# Patient Record
Sex: Female | Born: 1970 | Race: White | Hispanic: No | Marital: Married | Smoking: Never smoker
Health system: Southern US, Community
[De-identification: ages and names within clinical notes are randomized; demographics above are authoritative.]

## PROBLEM LIST (undated history)

## (undated) DIAGNOSIS — K589 Irritable bowel syndrome without diarrhea: Secondary | ICD-10-CM

## (undated) DIAGNOSIS — J45909 Unspecified asthma, uncomplicated: Secondary | ICD-10-CM

## (undated) DIAGNOSIS — T7840XA Allergy, unspecified, initial encounter: Secondary | ICD-10-CM

## (undated) DIAGNOSIS — D649 Anemia, unspecified: Secondary | ICD-10-CM

## (undated) HISTORY — PX: TONSILLECTOMY: SUR1361

## (undated) HISTORY — DX: Irritable bowel syndrome, unspecified: K58.9

## (undated) HISTORY — PX: COLONOSCOPY: SHX174

## (undated) HISTORY — DX: Allergy, unspecified, initial encounter: T78.40XA

## (undated) HISTORY — DX: Anemia, unspecified: D64.9

## (undated) HISTORY — PX: BREAST ENHANCEMENT SURGERY: SHX7

## (undated) HISTORY — PX: TYMPANOSTOMY TUBE PLACEMENT: SHX32

## (undated) HISTORY — PX: PARTIAL HYSTERECTOMY: SHX80

## (undated) HISTORY — PX: FOOT SURGERY: SHX648

## (undated) HISTORY — DX: Unspecified asthma, uncomplicated: J45.909

## (undated) HISTORY — PX: BREAST BIOPSY: SHX20

## (undated) HISTORY — PX: ADENOIDECTOMY: SUR15

---

## 2003-09-06 HISTORY — PX: PARTIAL HYSTERECTOMY: SHX80

## 2003-09-06 HISTORY — PX: AUGMENTATION MAMMAPLASTY: SUR837

## 2003-09-06 HISTORY — PX: BREAST ENHANCEMENT SURGERY: SHX7

## 2007-09-06 HISTORY — PX: FOOT SURGERY: SHX648

## 2013-06-17 ENCOUNTER — Other Ambulatory Visit: Payer: Self-pay | Admitting: Obstetrics and Gynecology

## 2013-06-17 DIAGNOSIS — Z803 Family history of malignant neoplasm of breast: Secondary | ICD-10-CM

## 2013-06-18 ENCOUNTER — Ambulatory Visit
Admission: RE | Admit: 2013-06-18 | Discharge: 2013-06-18 | Disposition: A | Discharge status: Home / Self Care | Payer: BC Managed Care – PPO | Source: Ambulatory Visit | Attending: Obstetrics and Gynecology | Admitting: Obstetrics and Gynecology

## 2013-06-18 DIAGNOSIS — Z803 Family history of malignant neoplasm of breast: Secondary | ICD-10-CM

## 2013-06-18 MED ORDER — GADOBENATE DIMEGLUMINE 529 MG/ML IV SOLN
10.0000 mL | Freq: Once | INTRAVENOUS | Status: AC | PRN
  Administered 2013-06-18: 10 mL via INTRAVENOUS

## 2013-06-19 ENCOUNTER — Other Ambulatory Visit: Payer: Self-pay | Admitting: Obstetrics and Gynecology

## 2013-06-19 DIAGNOSIS — R928 Other abnormal and inconclusive findings on diagnostic imaging of breast: Secondary | ICD-10-CM

## 2013-06-25 ENCOUNTER — Other Ambulatory Visit: Payer: BC Managed Care – PPO

## 2013-06-26 ENCOUNTER — Ambulatory Visit
Admission: RE | Admit: 2013-06-26 | Discharge: 2013-06-26 | Disposition: A | Discharge status: Home / Self Care | Payer: BC Managed Care – PPO | Source: Ambulatory Visit | Attending: Obstetrics and Gynecology | Admitting: Obstetrics and Gynecology

## 2013-06-26 ENCOUNTER — Other Ambulatory Visit: Payer: Self-pay | Admitting: Obstetrics and Gynecology

## 2013-06-26 DIAGNOSIS — R928 Other abnormal and inconclusive findings on diagnostic imaging of breast: Secondary | ICD-10-CM

## 2013-09-05 HISTORY — PX: BREAST BIOPSY: SHX20

## 2013-12-03 ENCOUNTER — Other Ambulatory Visit: Payer: Self-pay | Admitting: Obstetrics and Gynecology

## 2013-12-03 DIAGNOSIS — N6310 Unspecified lump in the right breast, unspecified quadrant: Secondary | ICD-10-CM

## 2013-12-03 DIAGNOSIS — R928 Other abnormal and inconclusive findings on diagnostic imaging of breast: Secondary | ICD-10-CM

## 2013-12-24 ENCOUNTER — Ambulatory Visit
Admission: RE | Admit: 2013-12-24 | Discharge: 2013-12-24 | Disposition: A | Discharge status: Home / Self Care | Payer: BC Managed Care – PPO | Source: Ambulatory Visit | Attending: Obstetrics and Gynecology | Admitting: Obstetrics and Gynecology

## 2013-12-24 ENCOUNTER — Encounter (INDEPENDENT_AMBULATORY_CARE_PROVIDER_SITE_OTHER): Payer: Self-pay

## 2013-12-24 DIAGNOSIS — N6310 Unspecified lump in the right breast, unspecified quadrant: Secondary | ICD-10-CM

## 2013-12-24 DIAGNOSIS — R928 Other abnormal and inconclusive findings on diagnostic imaging of breast: Secondary | ICD-10-CM

## 2014-05-26 ENCOUNTER — Other Ambulatory Visit: Payer: Self-pay

## 2014-05-26 DIAGNOSIS — Z1231 Encounter for screening mammogram for malignant neoplasm of breast: Secondary | ICD-10-CM

## 2014-06-02 ENCOUNTER — Other Ambulatory Visit: Payer: Self-pay | Admitting: Obstetrics and Gynecology

## 2014-06-03 LAB — CYTOLOGY - PAP

## 2014-06-27 ENCOUNTER — Ambulatory Visit
Admission: RE | Admit: 2014-06-27 | Discharge: 2014-06-27 | Disposition: A | Discharge status: Home / Self Care | Payer: BC Managed Care – PPO | Source: Ambulatory Visit

## 2014-06-27 DIAGNOSIS — Z1231 Encounter for screening mammogram for malignant neoplasm of breast: Secondary | ICD-10-CM

## 2015-03-22 IMAGING — MG MM SCREENING BREAST W/IMPLANT TOMO BILATERAL
9 of 12 series · 9 of 28 positions shown · non-contrast
Comparison: Previous exam(s)

CLINICAL DATA: Screening.

EXAM:
DIGITAL SCREENING BILATERAL MAMMOGRAM WITH IMPLANTS, 3D TOMO WITH
CAD
The patient has retropectoral implants. Standard and implant
displaced views were performed.

[L MLO (1 of 2)]
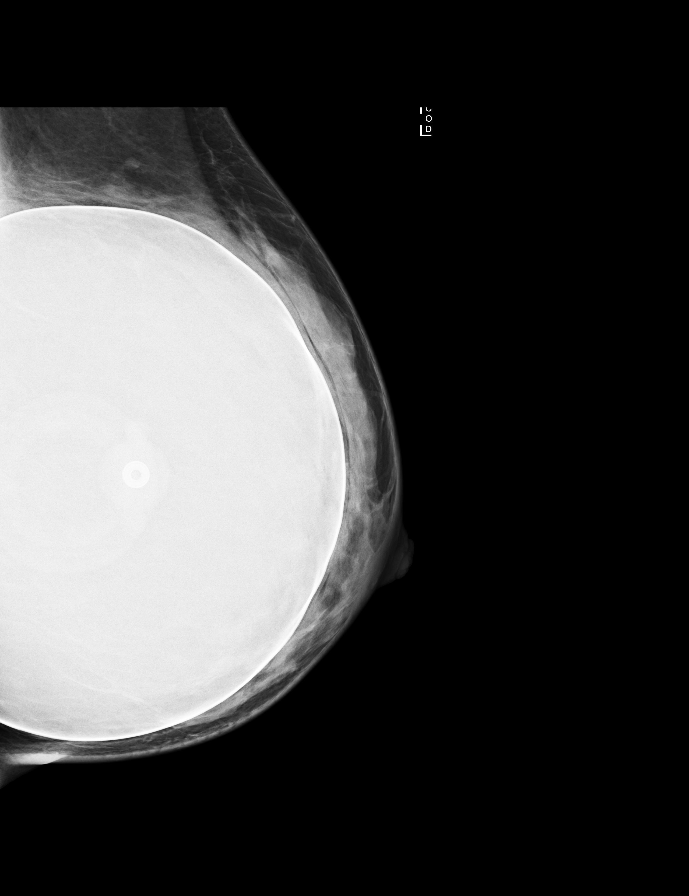

[R MLO (1 of 2)]
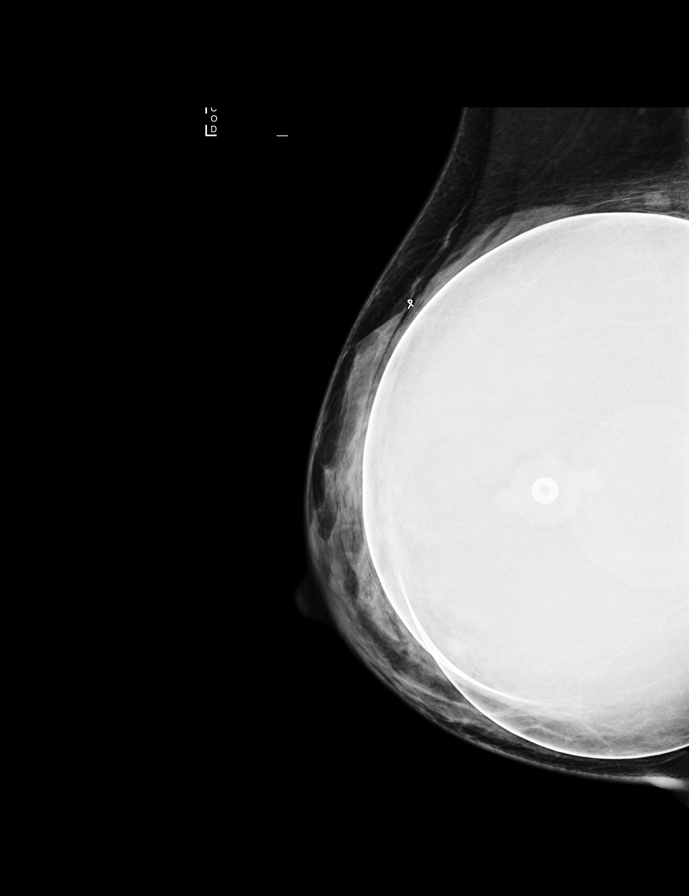

[R CC (1 of 2)]
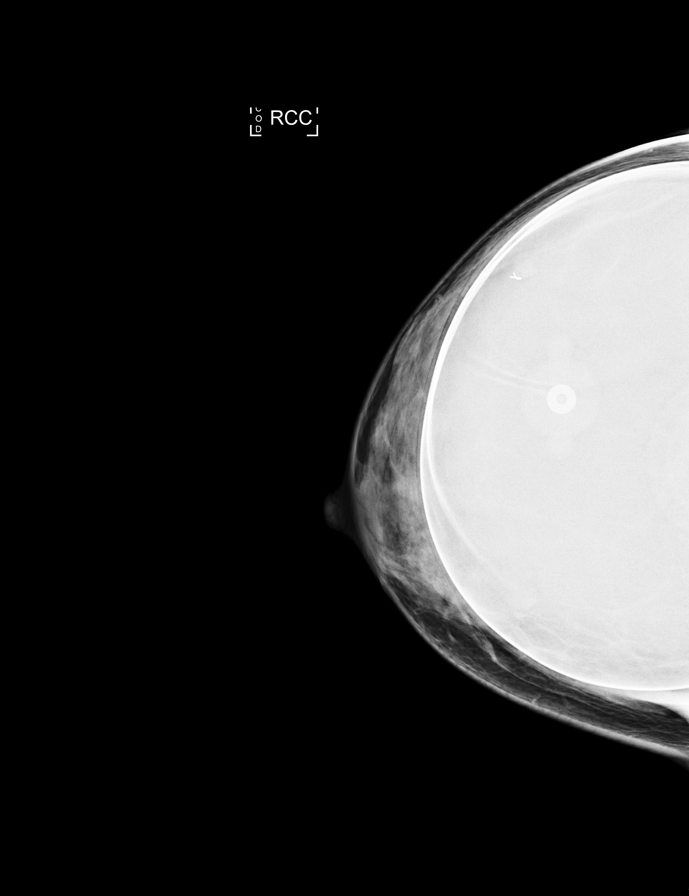

[L CC (1 of 2)]
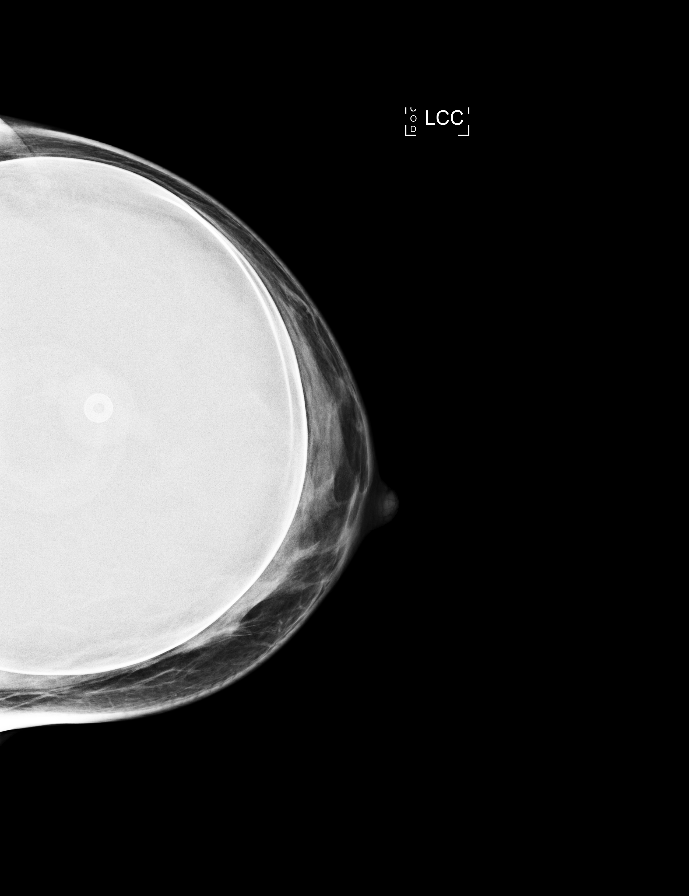

[R MLO (2 of 2)]
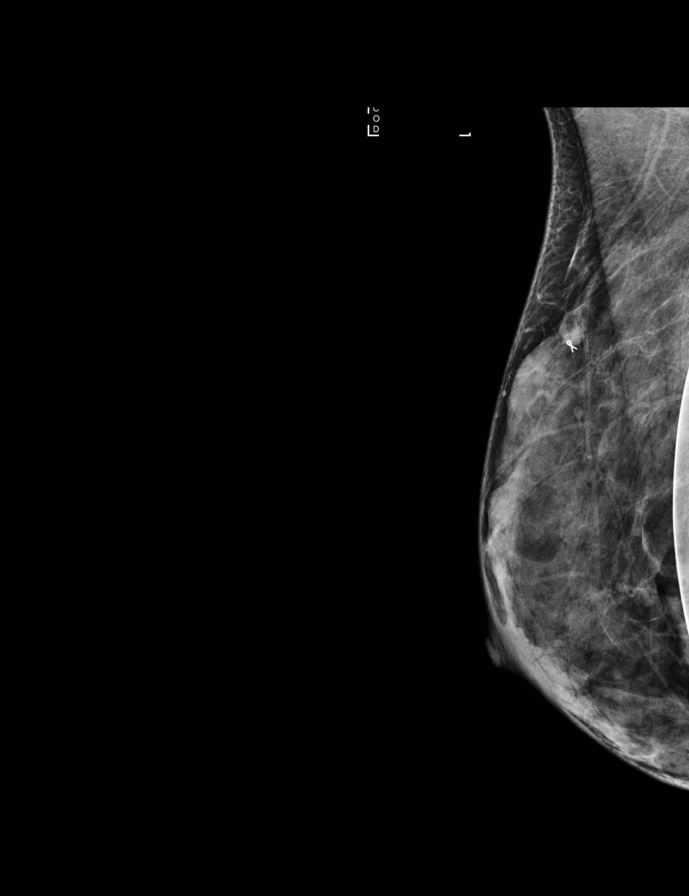

[R CC (2 of 2)]
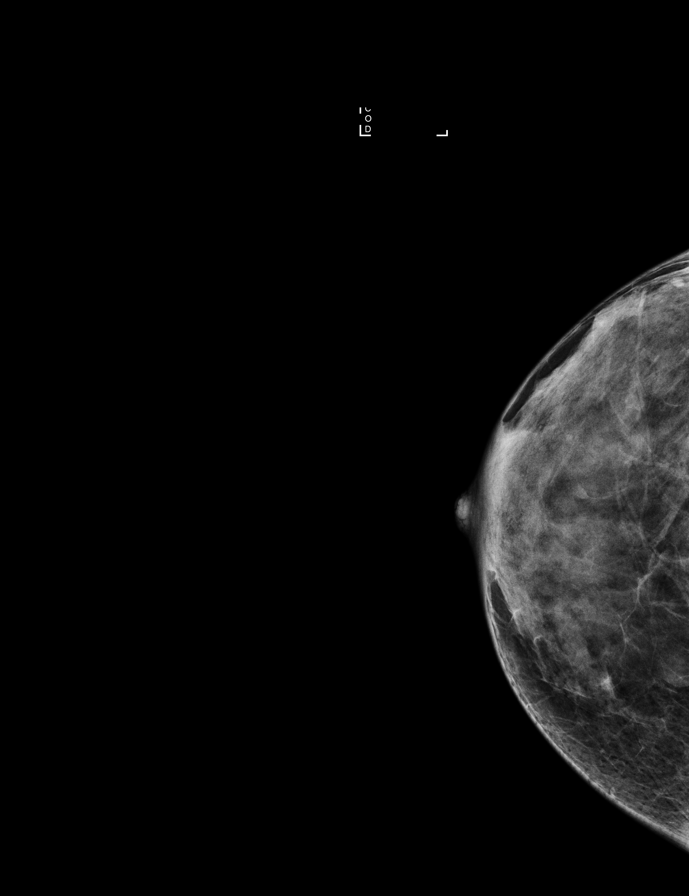

[L MLO (2 of 2)]
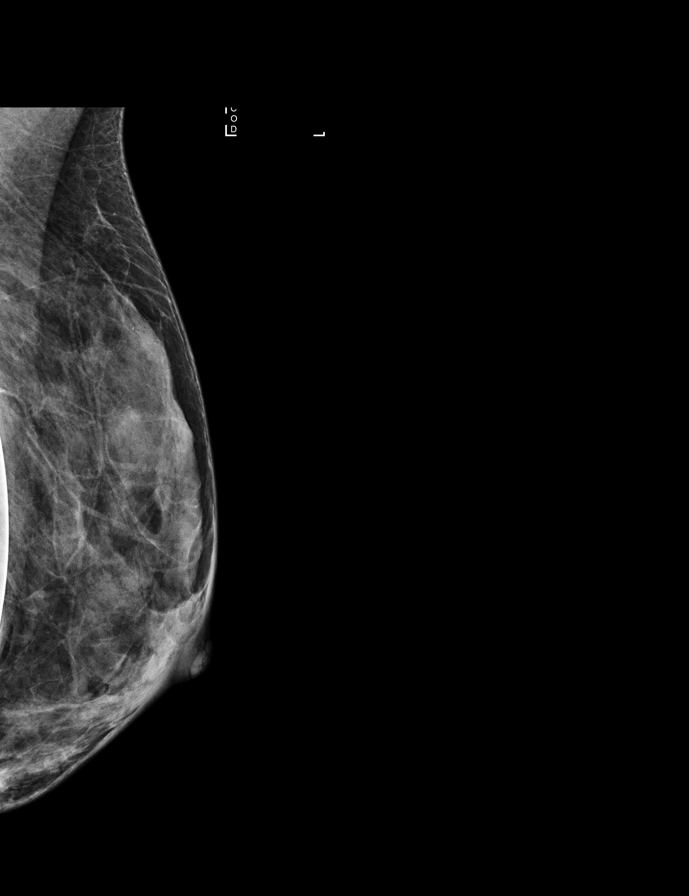

[L CC (2 of 2)]
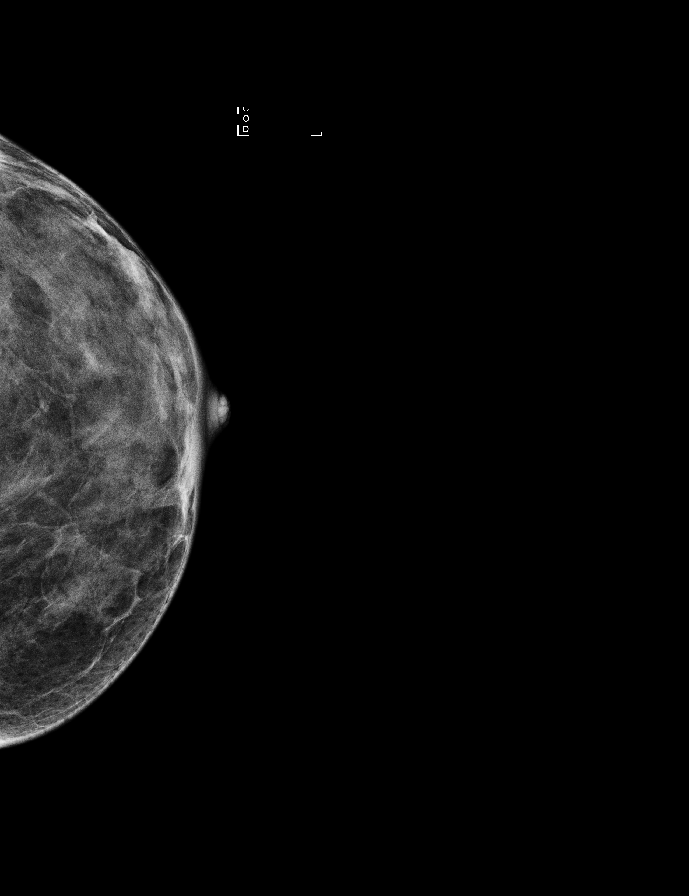

[L CC tomo · tomo slice 15/28.0]
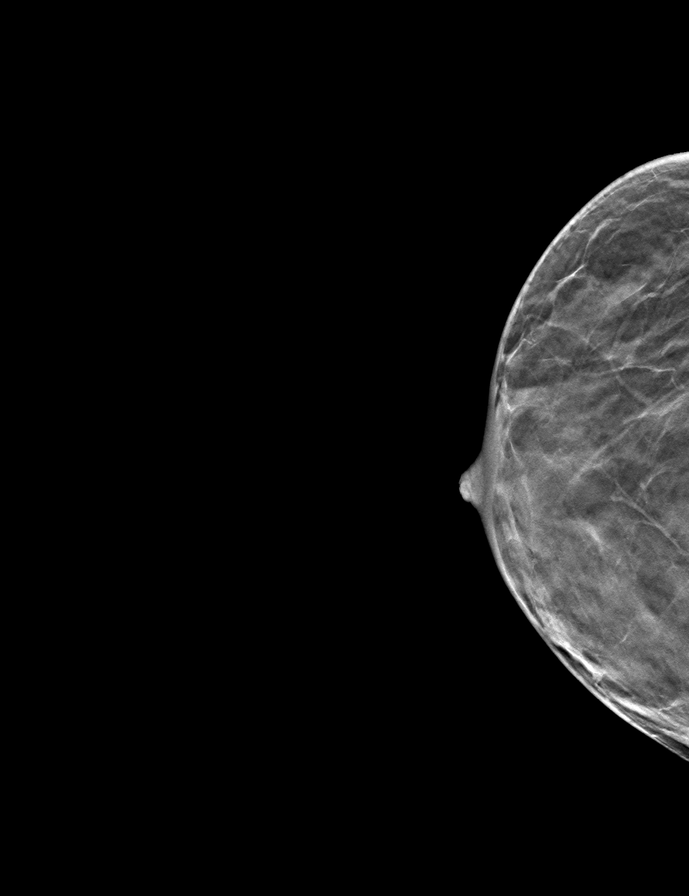

[9 of 28 positions shown; findings below may reference images not displayed]

ACR Breast Density Category d: The breast tissue is extremely dense,
which lowers the sensitivity of mammography.
FINDINGS: There are no findings suspicious for malignancy. Images were
processed with CAD.
IMPRESSION: No mammographic evidence of malignancy. A result letter of this
screening mammogram will be mailed directly to the patient.

RECOMMENDATION:
Screening mammogram in one year. (Code:[id number]-P-8EF)

BI-RADS CATEGORY  1:  Negative.

## 2015-11-20 ENCOUNTER — Encounter: Payer: Self-pay | Admitting: Internal Medicine

## 2015-11-20 ENCOUNTER — Ambulatory Visit (INDEPENDENT_AMBULATORY_CARE_PROVIDER_SITE_OTHER): Payer: BLUE CROSS/BLUE SHIELD | Admitting: Internal Medicine

## 2015-11-20 VITALS — BP 110/70 | HR 76 | Temp 98.7°F | Resp 16 | Ht 63.0 in

## 2015-11-20 DIAGNOSIS — J3089 Other allergic rhinitis: Secondary | ICD-10-CM | POA: Diagnosis not present

## 2015-11-20 DIAGNOSIS — Z91030 Bee allergy status: Secondary | ICD-10-CM | POA: Diagnosis not present

## 2015-11-20 DIAGNOSIS — J302 Other seasonal allergic rhinitis: Secondary | ICD-10-CM | POA: Insufficient documentation

## 2015-11-20 DIAGNOSIS — J4520 Mild intermittent asthma, uncomplicated: Secondary | ICD-10-CM | POA: Insufficient documentation

## 2015-11-20 DIAGNOSIS — J4521 Mild intermittent asthma with (acute) exacerbation: Secondary | ICD-10-CM

## 2015-11-20 DIAGNOSIS — Z91038 Other insect allergy status: Secondary | ICD-10-CM | POA: Insufficient documentation

## 2015-11-20 MED ORDER — EPINEPHRINE 0.3 MG/0.3ML IJ SOAJ: INTRAMUSCULAR | Status: DC

## 2015-11-20 MED ORDER — ALBUTEROL SULFATE HFA 108 (90 BASE) MCG/ACT IN AERS
2.0000 | INHALATION_SPRAY | RESPIRATORY_TRACT | Status: DC | PRN
Start: 2015-11-20 — End: 2016-05-23

## 2015-11-20 MED ORDER — MOMETASONE FUROATE 50 MCG/ACT NA SUSP: 2.0000 | Freq: Every day | NASAL | Status: DC

## 2015-11-20 MED ORDER — LEVOCETIRIZINE DIHYDROCHLORIDE 5 MG PO TABS: 5.0000 mg | ORAL_TABLET | Freq: Every evening | ORAL | Status: DC

## 2015-11-20 NOTE — Patient Instructions (Signed)
Mild intermittent asthma  Currently not well controlled due to environmental allergies  Refilled albuterol (pro-air) to be used as needed Given Prednisone 10 mg tablets. Take 1 tablet twice a day for 4 days, then 1 tablet on day #5.  She will use if her symptoms do not improve   Other allergic rhinitis  Currently not well controlled due to springtime allergens  Refill Xyzal 5 mg daily and Nasonex 2 sprays each nostril daily  Given prednisone as above if symptoms do not improve   History of bee sting allergy  Continue insect avoidance measures  Has EpiPen and action plan educated on use  She wishes to defer insect venom skin testing for now.

## 2015-11-20 NOTE — Assessment & Plan Note (Signed)
   Continue insect avoidance measures  Has EpiPen and action plan educated on use  She wishes to defer insect venom skin testing for now.

## 2015-11-20 NOTE — Assessment & Plan Note (Signed)
   Currently not well controlled due to springtime allergens  Refill Xyzal 5 mg daily and Nasonex 2 sprays each nostril daily  Given prednisone as above if symptoms do not improve

## 2015-11-20 NOTE — Assessment & Plan Note (Signed)
   Currently not well controlled due to environmental allergies  Refilled albuterol (pro-air) to be used as needed Given Prednisone 10 mg tablets. Take 1 tablet twice a day for 4 days, then 1 tablet on day #5.  She will use if her symptoms do not improve

## 2015-11-20 NOTE — Progress Notes (Signed)
History of Present Illness: Cheryl Mata is a 45 y.o. female presenting for follow-up.  HPI Comments: Asthma: Since her last visit, symptoms were reasonably well controlled until the onset of spring when she developed intermittent episodes of shortness of breath associated with cold weather. She did not have albuterol so she just rested and her symptoms gradually improved. She has not had any severe exacerbations requiring prednisone. Because of her skin symptom control, she stopped Qvar along time ago.  Allergic rhinitis: Skin testing in the past was positive for grass, weed, tree, mold, dust mite, cat, dog, horse. She was doing well until the onset of spring as above when she developed itching, nasal congestion/drainage/pruritus. She has not had any interval sinus infections. In the past, Xyzal and Nasonex have controlled her symptoms well but she has been out of these medications.   No current outpatient prescriptions on file prior to visit.   No current facility-administered medications on file prior to visit.    Assessment and Plan: Mild intermittent asthma  Currently not well controlled due to environmental allergies  Refilled albuterol (pro-air) to be used as needed Given Prednisone 10 mg tablets. Take 1 tablet twice a day for 4 days, then 1 tablet on day #5.  She will use if her symptoms do not improve   Other allergic rhinitis  Currently not well controlled due to springtime allergens  Refill Xyzal 5 mg daily and Nasonex 2 sprays each nostril daily  Given prednisone as above if symptoms do not improve   History of bee sting allergy  Continue insect avoidance measures  Has EpiPen and action plan educated on use  She wishes to defer insect venom skin testing for now.    Return in about 6 months (around 05/22/2016).  Meds ordered this encounter  Medications  . DISCONTD: fexofenadine (ALLEGRA) 180 MG tablet    Sig: Take 180 mg by mouth daily.  Marland Kitchen albuterol (PROAIR  HFA) 108 (90 Base) MCG/ACT inhaler    Sig: Inhale 2 puffs into the lungs every 4 (four) hours as needed for wheezing or shortness of breath. Reported on 11/20/2015    Dispense:  1 Inhaler    Refill:  1  . EPINEPHrine (EPIPEN 2-PAK) 0.3 mg/0.3 mL IJ SOAJ injection    Sig: Use as directed for severe allergic reaction    Dispense:  2 Device    Refill:  1    Dispense mylan generic brand  . levocetirizine (XYZAL) 5 MG tablet    Sig: Take 1 tablet (5 mg total) by mouth every evening. Reported on 11/20/2015    Dispense:  34 tablet    Refill:  5    For runny nose or itching  . mometasone (NASONEX) 50 MCG/ACT nasal spray    Sig: Place 2 sprays into the nose daily. Reported on 11/20/2015    Dispense:  17 g    Refill:  5    For stuffy nose or drainage    Diagnostics: Spirometry: FEV1 2.68L or 95%, FEV1/FVC  80%.  This is a normal study  Physical Exam: BP 110/70 mmHg  Pulse 76  Temp(Src) 98.7 F (37.1 C)  Resp 16  Ht 5\' 3"  (1.6 m)   Physical Exam  Constitutional: She appears well-developed and well-nourished. No distress.  HENT:  Right Ear: External ear normal.  Left Ear: External ear normal.  Mouth/Throat: Oropharynx is clear and moist.  Nasal pruritus and membrane edema with clear rhinorrhea  Eyes: Conjunctivae are normal. Right eye exhibits no  discharge. Left eye exhibits no discharge.  Cardiovascular: Normal rate, regular rhythm and normal heart sounds.   No murmur heard. Pulmonary/Chest: Effort normal and breath sounds normal. No respiratory distress. She has no wheezes. She has no rales.  Abdominal: Soft. Bowel sounds are normal.  Musculoskeletal: She exhibits no edema.  Lymphadenopathy:    She has no cervical adenopathy.  Neurological: She is alert.  Skin: No rash noted.  Vitals reviewed.   Drug Allergies:  No Known Allergies  ROS: Per HPI unless specifically indicated below Review of Systems  Thank you for the opportunity to care for this patient.  Please do not  hesitate to contact me with questions.

## 2015-11-30 ENCOUNTER — Encounter: Payer: Self-pay | Admitting: *Deleted

## 2015-12-08 DIAGNOSIS — F4325 Adjustment disorder with mixed disturbance of emotions and conduct: Secondary | ICD-10-CM | POA: Diagnosis not present

## 2015-12-15 DIAGNOSIS — F4325 Adjustment disorder with mixed disturbance of emotions and conduct: Secondary | ICD-10-CM | POA: Diagnosis not present

## 2015-12-22 DIAGNOSIS — F4325 Adjustment disorder with mixed disturbance of emotions and conduct: Secondary | ICD-10-CM | POA: Diagnosis not present

## 2015-12-29 DIAGNOSIS — F4325 Adjustment disorder with mixed disturbance of emotions and conduct: Secondary | ICD-10-CM | POA: Diagnosis not present

## 2016-01-05 DIAGNOSIS — F4325 Adjustment disorder with mixed disturbance of emotions and conduct: Secondary | ICD-10-CM | POA: Diagnosis not present

## 2016-01-12 DIAGNOSIS — F4325 Adjustment disorder with mixed disturbance of emotions and conduct: Secondary | ICD-10-CM | POA: Diagnosis not present

## 2016-01-14 DIAGNOSIS — F4325 Adjustment disorder with mixed disturbance of emotions and conduct: Secondary | ICD-10-CM | POA: Diagnosis not present

## 2016-01-19 DIAGNOSIS — F4325 Adjustment disorder with mixed disturbance of emotions and conduct: Secondary | ICD-10-CM | POA: Diagnosis not present

## 2016-01-26 DIAGNOSIS — F4325 Adjustment disorder with mixed disturbance of emotions and conduct: Secondary | ICD-10-CM | POA: Diagnosis not present

## 2016-01-28 DIAGNOSIS — F4325 Adjustment disorder with mixed disturbance of emotions and conduct: Secondary | ICD-10-CM | POA: Diagnosis not present

## 2016-02-02 DIAGNOSIS — F4325 Adjustment disorder with mixed disturbance of emotions and conduct: Secondary | ICD-10-CM | POA: Diagnosis not present

## 2016-04-26 DIAGNOSIS — B009 Herpesviral infection, unspecified: Secondary | ICD-10-CM | POA: Diagnosis not present

## 2016-05-23 ENCOUNTER — Ambulatory Visit (INDEPENDENT_AMBULATORY_CARE_PROVIDER_SITE_OTHER): Payer: BLUE CROSS/BLUE SHIELD | Admitting: Pediatrics

## 2016-05-23 ENCOUNTER — Encounter: Payer: Self-pay | Admitting: Pediatrics

## 2016-05-23 VITALS — BP 124/78 | HR 76 | Temp 98.3°F | Resp 16 | Ht 63.19 in | Wt 122.2 lb

## 2016-05-23 DIAGNOSIS — J4520 Mild intermittent asthma, uncomplicated: Secondary | ICD-10-CM | POA: Diagnosis not present

## 2016-05-23 DIAGNOSIS — J3089 Other allergic rhinitis: Secondary | ICD-10-CM | POA: Diagnosis not present

## 2016-05-23 DIAGNOSIS — Z91038 Other insect allergy status: Secondary | ICD-10-CM

## 2016-05-23 MED ORDER — ALBUTEROL SULFATE HFA 108 (90 BASE) MCG/ACT IN AERS: 2.0000 | INHALATION_SPRAY | RESPIRATORY_TRACT | 3 refills | Status: DC | PRN

## 2016-05-23 NOTE — Progress Notes (Signed)
  [place] [facility] [id number] Dept: [phone]  FOLLOW UP NOTE  Patient ID: Cheryl Mata, female    DOB: 1970/12/01  Age: 45 y.o. MRN: [id number] Date of Office Visit: 05/23/2016  Assessment  Chief Complaint: Allergic Rhinitis  (doing good) and Asthma (doing good)  HPI Cheryl Mata presents for follow-up of asthma and allergic rhinitis. Her asthma is worse  in the winter months. Her allergic symptoms are worse in the fall. She has not had an insect sting  Current medications are  Pro-air 2 puffs every 4 hours if needed and Benadryl and EpiPen 0.3 mg if needed   Drug Allergies:  No Known Allergies  Physical Exam: BP 124/78 (BP Location: Left Arm, Patient Position: Sitting, Cuff Size: Normal)   Pulse 76   Temp 98.3 F (36.8 C) (Oral)   Resp 16   Ht 5' 3.19" (1.605 m)   Wt 122 lb 3.2 oz (55.4 kg)   BMI 21.52 kg/m    Physical Exam  Constitutional: She is oriented to person, place, and time. She appears well-developed and well-nourished.  HENT:  Eyes normal. Ears normal. Nose normal. Pharynx normal.  Neck: Neck supple.  Cardiovascular:  S1 and S2 normal no murmurs  Pulmonary/Chest:  Clear to percussion and auscultation  Lymphadenopathy:    She has no cervical adenopathy.  Neurological: She is alert and oriented to person, place, and time.  Psychiatric: She has a normal mood and affect. Her behavior is normal. Judgment and thought content normal.  Vitals reviewed.   Diagnostics:  FVC 3.14 L FEV1 2.63 L. Predicted FVC 3.31 L predicted FEV1 2.54 L-the spirometry is in the normal range  Assessment and Plan: 1. Mild intermittent asthma, uncomplicated   2. Other allergic rhinitis   3. History of insect sting allergy     Meds ordered this encounter  Medications  . albuterol (PROAIR HFA) 108 (90 Base) MCG/ACT inhaler    Sig: Inhale 2 puffs into the lungs every 4 (four) hours as needed for wheezing or shortness of breath.    Dispense:  1 Inhaler    Refill:   3    Patient Instructions  Xyzal 5 mg-take 1 tablet once a day if needed for runny nose or itchy eyes Nasacort AQ 2 sprays per nostril once a day if needed for stuffy nose Pro-air 2 puffs every 4 hours if needed for wheezing or coughing spells Call me if you are not doing better on this treatment plan  If you have an insect sting take Benadryl 4 teaspoonfuls every 6 hours and if you have life-threatening symptoms inject  with EpiPen 0.3 mg   Return in about 1 year (around 05/23/2017).    Thank you for the opportunity to care for this patient.  Please do not hesitate to contact me with questions.  Penne Lash, M.D.  [facility] [address] [place], [place] [address] [phone]

## 2016-05-23 NOTE — Patient Instructions (Addendum)
Xyzal 5 mg-take 1 tablet once a day if needed for runny nose or itchy eyes Nasacort AQ 2 sprays per nostril once a day if needed for stuffy nose Pro-air 2 puffs every 4 hours if needed for wheezing or coughing spells Call me if you are not doing better on this treatment plan  If you have an insect sting take Benadryl 4 teaspoonfuls every 6 hours and if you have life-threatening symptoms inject  with EpiPen 0.3 mg

## 2016-09-14 DIAGNOSIS — Z6821 Body mass index (BMI) 21.0-21.9, adult: Secondary | ICD-10-CM | POA: Diagnosis not present

## 2016-09-14 DIAGNOSIS — Z780 Asymptomatic menopausal state: Secondary | ICD-10-CM | POA: Diagnosis not present

## 2016-09-14 DIAGNOSIS — M25559 Pain in unspecified hip: Secondary | ICD-10-CM | POA: Diagnosis not present

## 2016-09-14 DIAGNOSIS — Z01419 Encounter for gynecological examination (general) (routine) without abnormal findings: Secondary | ICD-10-CM | POA: Diagnosis not present

## 2016-09-14 DIAGNOSIS — Z1231 Encounter for screening mammogram for malignant neoplasm of breast: Secondary | ICD-10-CM | POA: Diagnosis not present

## 2016-09-26 DIAGNOSIS — M25551 Pain in right hip: Secondary | ICD-10-CM | POA: Diagnosis not present

## 2016-09-26 DIAGNOSIS — M25552 Pain in left hip: Secondary | ICD-10-CM | POA: Diagnosis not present

## 2016-09-27 DIAGNOSIS — M25552 Pain in left hip: Secondary | ICD-10-CM | POA: Diagnosis not present

## 2016-09-27 DIAGNOSIS — M25551 Pain in right hip: Secondary | ICD-10-CM | POA: Diagnosis not present

## 2016-12-08 ENCOUNTER — Other Ambulatory Visit: Payer: Self-pay | Admitting: *Deleted

## 2016-12-08 MED ORDER — EPINEPHRINE 0.3 MG/0.3ML IJ SOAJ: INTRAMUSCULAR | 2 refills | Status: DC

## 2017-05-10 ENCOUNTER — Ambulatory Visit: Payer: BLUE CROSS/BLUE SHIELD | Admitting: Allergy and Immunology

## 2017-05-25 DIAGNOSIS — J4521 Mild intermittent asthma with (acute) exacerbation: Secondary | ICD-10-CM | POA: Diagnosis not present

## 2017-05-25 DIAGNOSIS — J301 Allergic rhinitis due to pollen: Secondary | ICD-10-CM | POA: Diagnosis not present

## 2017-06-09 ENCOUNTER — Encounter: Payer: Self-pay | Admitting: Allergy & Immunology

## 2017-06-09 ENCOUNTER — Ambulatory Visit: Payer: BLUE CROSS/BLUE SHIELD | Admitting: Allergy & Immunology

## 2017-06-09 ENCOUNTER — Ambulatory Visit (INDEPENDENT_AMBULATORY_CARE_PROVIDER_SITE_OTHER): Payer: BLUE CROSS/BLUE SHIELD | Admitting: Allergy & Immunology

## 2017-06-09 VITALS — BP 112/64 | HR 74 | Resp 16 | Ht 63.0 in | Wt 125.0 lb

## 2017-06-09 DIAGNOSIS — J4520 Mild intermittent asthma, uncomplicated: Secondary | ICD-10-CM | POA: Diagnosis not present

## 2017-06-09 DIAGNOSIS — J3089 Other allergic rhinitis: Secondary | ICD-10-CM | POA: Diagnosis not present

## 2017-06-09 DIAGNOSIS — T63481A Toxic effect of venom of other arthropod, accidental (unintentional), initial encounter: Secondary | ICD-10-CM

## 2017-06-09 NOTE — Progress Notes (Signed)
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 Mild intermittent asthma, uncomplicated - Lung testing looked good today. - Daily controller medication(s): Qvar 1mcg Redihaler 2 puffs twice daily during the fall season - Prior to physical activity: ProAir 2 puffs 10-15 minutes before physical activity. - Rescue medications: ProAir 4 puffs every 4-6 hours as needed - Changes during respiratory infections or worsening symptoms: Increase Qvar 90mcg to 4 puffs twice daily for TWO WEEKS. - Asthma control goals:  * Full participation in all desired activities (may need albuterol before activity) * Albuterol use two time or less a week on average (not counting use with activity) * Cough interfering with sleep two time or less a month * Oral steroids no more than once a year * No hospitalizations  2. Allergic rhinitis (ragweed, molds) - Continue with Nasonex two sprays per nostril 1-2 times daily. - Continue with Xyzal 5mg  once daily  3. 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 - We can do testing in the future if needed.   4. Return in about 1 year (around 06/09/2018).  Subjective:   Cheryl Mata is a 46 y.o. female presenting today for follow up of  Chief Complaint  Patient presents with  . Asthma    Cheryl Mata has a history of the following: Patient Active Problem List   Diagnosis Date Noted  . 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 Other allergic rhinitis 11/20/2015  . Mild intermittent asthma, uncomplicated 59/93/5701  . History of insect sting allergy 11/20/2015    History obtained from: chart review and patient.  [name] Primary Care Provider is Corey Harold, MD.     Shaneen is a 46  y.o. female presenting for a follow up visit. She was last seen in September 2017 At that time, it seems that she was doing well. She was continued on Xyzal 5mg  daily as well as Nasacort and ProAir as needed. She does have an insect allergy and has EpiPen to use as needed.   Since the last visit, she has done well. She did need to go to her PCP a couple of weeks ago for allergy and asthma symptoms. "She was started on Qvar 80 two puffs ing twice daily. She feels that her symptoms are feeling better with the Qvar. She does not think that she will use it throughout the year. Fall is the worst time of the year for her, so she is planning to only use it at that time.   She remains on the Nasanex two sprays per nostril 1-2 times daily. She remains on the Xyzal as well. She does need refills on the Xyzal.   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 She has reacted to yellow jackets and hornets. She has never undergone the testing. She thinks that she has an EpiPen but it might be out of date. She does spend a lot of time outdoors but she does not let this diagnosis stop her. She is not interested in testing or immunotherapy at this time.   Otherwise, there have been no changes to her past medical history, surgical history, family history, or social history. She works in [employer] for [employer] here in [place].     Review of  Systems: a 14-point review of systems is pertinent for what is mentioned in HPI.  Otherwise, all other systems were negative. Constitutional: negative other than that listed in the HPI Eyes: negative other than that listed in the HPI Ears, nose, mouth, throat, and face: negative other than that listed in the HPI Respiratory: negative other than that listed in the HPI Cardiovascular: negative other than that listed in the HPI Gastrointestinal: negative other than that listed in the HPI Genitourinary: negative other than that listed in the HPI Integument: negative other than that listed in the  HPI Hematologic: negative other than that listed in the HPI Musculoskeletal: negative other than that listed in the HPI Neurological: negative other than that listed in the HPI Allergy/Immunologic: negative other than that listed in the HPI    Objective:   Blood pressure 112/64, pulse 74, resp. rate 16, height 5\' 3"  (1.6 m), weight 125 lb (56.7 kg), SpO2 98 %. Body mass index is 22.14 kg/m.   Physical Exam:  General: Alert, interactive, in no acute distress. Pleasant female. Interactive.  Eyes: No conjunctival injection bilaterally, no discharge on the right and no discharge on the left. PERRL bilaterally. EOMI without pain. No photophobia.  Ears: Right TM pearly gray with normal light reflex, Left TM pearly gray with normal light reflex, Right TM unable to be visualized due to cerumen impaction and Left TM unable to be visualized due to cerumen impaction.  Nose/Throat: External nose within normal limits and septum midline. Turbinates edematous with clear discharge. Posterior oropharynx mildly erythematous without cobblestoning in the posterior oropharynx. Tonsils 2+ without exudates.  Tongue without thrush. Adenopathy: shoddy bilateral anterior cervical lymphadenopathy Lungs: Clear to auscultation without wheezing, rhonchi or rales. No increased work of breathing. CV: Normal S1/S2. No murmurs. Capillary refill <2 seconds.  Skin: Warm and dry, without lesions or rashes. Neuro:   Grossly intact. No focal deficits appreciated. Responsive to questions.  Diagnostic studies:   Spirometry: Spirometry: Normal FEV1, FVC, and FEV1/FVC ratio. There is no scooping suggestive of obstructive disease.       Salvatore Marvel, MD [facility]

## 2017-06-09 NOTE — Patient Instructions (Addendum)
1. Mild intermittent asthma, uncomplicated - Lung testing looked good today. - Daily controller medication(s): Qvar 50mcg Redihaler 2 puffs twice daily during the fall season - Prior to physical activity: ProAir 2 puffs 10-15 minutes before physical activity. - Rescue medications: ProAir 4 puffs every 4-6 hours as needed - Changes during respiratory infections or worsening symptoms: Increase Qvar 12mcg to 4 puffs twice daily for TWO WEEKS. - Asthma control goals:  * Full participation in all desired activities (may need albuterol before activity) * Albuterol use two time or less a week on average (not counting use with activity) * Cough interfering with sleep two time or less a month * Oral steroids no more than once a year * No hospitalizations  2. Allergic rhinitis (ragweed, molds) - Continue with Nasonex two sprays per nostril 1-2 times daily. - Continue with Xyzal 5mg  once daily  3. 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 - We can do testing in the future if needed.   4. Return in about 1 year (around 06/09/2018).   Please inform us of any Emergency Department visits, hospitalizations, or changes in symptoms. Call us before going to the ED for breathing or allergy symptoms since we might be able to fit you in for a sick visit. Feel free to contact us anytime with any questions, problems, or concerns.  It was a pleasure to meet you today! Enjoy the upcoming fall season!  Websites that have reliable patient information: 1. American Academy of Asthma, Allergy, and Immunology: www.aaaai.org 2. Food Allergy Research and Education (FARE): foodallergy.org 3. Mothers of Asthmatics: http://www.asthmacommunitynetwork.org 4. American College of Allergy, Asthma, and Immunology: www.acaai.org   Election Day is coming up on Tuesday, November 6th! Make your voice heard! Register to vote at [email or web address].[email or web address]!     [place] Board of Elections:  [email or web address]  [employer] of Elections:  [email or web address]

## 2017-06-22 DIAGNOSIS — M9903 Segmental and somatic dysfunction of lumbar region: Secondary | ICD-10-CM | POA: Diagnosis not present

## 2017-06-22 DIAGNOSIS — M9901 Segmental and somatic dysfunction of cervical region: Secondary | ICD-10-CM | POA: Diagnosis not present

## 2017-06-22 DIAGNOSIS — M9902 Segmental and somatic dysfunction of thoracic region: Secondary | ICD-10-CM | POA: Diagnosis not present

## 2017-06-22 DIAGNOSIS — M9905 Segmental and somatic dysfunction of pelvic region: Secondary | ICD-10-CM | POA: Diagnosis not present

## 2017-06-28 DIAGNOSIS — M9905 Segmental and somatic dysfunction of pelvic region: Secondary | ICD-10-CM | POA: Diagnosis not present

## 2017-06-28 DIAGNOSIS — M9901 Segmental and somatic dysfunction of cervical region: Secondary | ICD-10-CM | POA: Diagnosis not present

## 2017-06-28 DIAGNOSIS — M9903 Segmental and somatic dysfunction of lumbar region: Secondary | ICD-10-CM | POA: Diagnosis not present

## 2017-06-28 DIAGNOSIS — M9902 Segmental and somatic dysfunction of thoracic region: Secondary | ICD-10-CM | POA: Diagnosis not present

## 2017-07-25 DIAGNOSIS — M9901 Segmental and somatic dysfunction of cervical region: Secondary | ICD-10-CM | POA: Diagnosis not present

## 2017-07-25 DIAGNOSIS — M9905 Segmental and somatic dysfunction of pelvic region: Secondary | ICD-10-CM | POA: Diagnosis not present

## 2017-07-25 DIAGNOSIS — M9902 Segmental and somatic dysfunction of thoracic region: Secondary | ICD-10-CM | POA: Diagnosis not present

## 2017-07-25 DIAGNOSIS — M9903 Segmental and somatic dysfunction of lumbar region: Secondary | ICD-10-CM | POA: Diagnosis not present

## 2017-09-27 DIAGNOSIS — Z6821 Body mass index (BMI) 21.0-21.9, adult: Secondary | ICD-10-CM | POA: Diagnosis not present

## 2017-09-27 DIAGNOSIS — Z1212 Encounter for screening for malignant neoplasm of rectum: Secondary | ICD-10-CM | POA: Diagnosis not present

## 2017-09-27 DIAGNOSIS — Z1231 Encounter for screening mammogram for malignant neoplasm of breast: Secondary | ICD-10-CM | POA: Diagnosis not present

## 2017-09-27 DIAGNOSIS — Z01419 Encounter for gynecological examination (general) (routine) without abnormal findings: Secondary | ICD-10-CM | POA: Diagnosis not present

## 2018-01-11 ENCOUNTER — Ambulatory Visit (INDEPENDENT_AMBULATORY_CARE_PROVIDER_SITE_OTHER): Payer: BLUE CROSS/BLUE SHIELD | Admitting: Allergy & Immunology

## 2018-01-11 ENCOUNTER — Encounter: Payer: Self-pay | Admitting: Allergy & Immunology

## 2018-01-11 VITALS — BP 112/66 | HR 75 | Temp 98.2°F | Resp 16

## 2018-01-11 DIAGNOSIS — J3089 Other allergic rhinitis: Secondary | ICD-10-CM

## 2018-01-11 DIAGNOSIS — J4520 Mild intermittent asthma, uncomplicated: Secondary | ICD-10-CM | POA: Diagnosis not present

## 2018-01-11 DIAGNOSIS — J302 Other seasonal allergic rhinitis: Secondary | ICD-10-CM

## 2018-01-11 DIAGNOSIS — T63481A Toxic effect of venom of other arthropod, accidental (unintentional), initial encounter: Secondary | ICD-10-CM

## 2018-01-11 MED ORDER — ALBUTEROL SULFATE HFA 108 (90 BASE) MCG/ACT IN AERS: 2.0000 | INHALATION_SPRAY | RESPIRATORY_TRACT | 3 refills | Status: DC | PRN

## 2018-01-11 MED ORDER — FLUTICASONE FUROATE-VILANTEROL 100-25 MCG/INH IN AEPB: 1.0000 | INHALATION_SPRAY | Freq: Every day | RESPIRATORY_TRACT | 5 refills | Status: DC

## 2018-01-11 MED ORDER — LEVOCETIRIZINE DIHYDROCHLORIDE 5 MG PO TABS: 5.0000 mg | ORAL_TABLET | Freq: Every evening | ORAL | 5 refills | Status: DC

## 2018-01-11 MED ORDER — EPINEPHRINE 0.3 MG/0.3ML IJ SOAJ: INTRAMUSCULAR | 2 refills | Status: DC

## 2018-01-11 NOTE — Progress Notes (Signed)
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 Mild intermittent asthma, uncomplicated - Lung testing looked great today. - We will change to Breo 100/25 one puff once daily instead of Qvar during the fall season. - I am hopeful that this change will help decrease the incidence of bronchitis.  - Daily controller medication(s): Breo 100/25 one puff once daily during the fall season - Prior to physical activity: ProAir 2 puffs 10-15 minutes before physical activity. - Rescue medications: ProAir 4 puffs every 4-6 hours as needed - Changes during respiratory infections or worsening symptoms: Add on Qvar 19mcg to 4 puffs twice daily for TWO WEEKS. - Asthma control goals:  * Full participation in all desired activities (may need albuterol before activity) * Albuterol use two time or less a week on average (not counting use with activity) * Cough interfering with sleep two time or less a month * Oral steroids no more than once a year * No hospitalizations  2. Allergic rhinitis (ragweed, molds) - Start Nasonex aggressively in August or September and continue through the first frost. - Continue with Xyzal 5mg  once daily as needed.    3. 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 - We can consider venom immunotherapy in the future if needed.    4. Return in about 6 months (around 07/14/2018).  Subjective:   Cheryl Mata is a 47 y.o. female presenting today for follow up of  Chief Complaint  Patient presents with  . Asthma    itchy nose and eyes, some sneezing    Cheryl Mata has a history of the following: Patient Active Problem List    Diagnosis Date Noted  . 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 Other allergic rhinitis 11/20/2015  . Mild intermittent asthma, uncomplicated 32/35/5732  . History of insect sting allergy 11/20/2015    History obtained from: chart review and patient.  [name] Primary Care Provider is Corey Harold, MD.     Cheryl Mata is a 47 y.o. female presenting for a follow up visit.  She was last seen in October 2018.  At that time, her lung testing looked excellent.  We continued her on Qvar 80 mcg 2 puffs twice daily during the fall season since this is when the majority of her symptoms occurred.  She has a history of ragweed and mold allergy.  We continued her on Nasonex 2 sprays per nostril up to twice daily as well as Xyzal 5 mg daily.  She also has a history of anaphylaxis due to insect venom.  Since the last visit, she has done well. She has done relatively well from a breathing perspective. Her symptoms seem to be worse in the fall. There is one room at work which seems to trigger her breathing symptoms, so she avoids this conference room. She is unsure of what the particular trigger is, but in any case she does well as long as she avoids this room. She has not needed prednisone for breathing in quite a long time.   She does have a history of sinus infections which she gets around twice per year. These occur in conjunction with episodes of bronchitis. She  never gets prednisone from these, but this is not from lacking of trying on the part of other medical doctors. These tend to be centered in the fall and in the spring season.  She does have a history of multiple acute otitis media as a child and in fact required 4 sets of tubes.  She has had 2 adenoidectomies and 1 tonsillectomy.  However, other than that her infectious history is not remarkable.  She has not required IV antibiotics to clear an infection.  Otherwise, there have been no changes to her past medical history, surgical history,  family history, or social history. She works at [employer] in the [employer]     Review of Systems: a 14-point review of systems is pertinent for what is mentioned in HPI.  Otherwise, all other systems were negative. Constitutional: negative other than that listed in the HPI Eyes: negative other than that listed in the HPI Ears, nose, mouth, throat, and face: negative other than that listed in the HPI Respiratory: negative other than that listed in the HPI Cardiovascular: negative other than that listed in the HPI Gastrointestinal: negative other than that listed in the HPI Genitourinary: negative other than that listed in the HPI Integument: negative other than that listed in the HPI Hematologic: negative other than that listed in the HPI Musculoskeletal: negative other than that listed in the HPI Neurological: negative other than that listed in the HPI Allergy/Immunologic: negative other than that listed in the HPI    Objective:   Blood pressure 112/66, pulse 75, temperature 98.2 F (36.8 C), temperature source Oral, resp. rate 16, SpO2 97 %. There is no height or weight on file to calculate BMI.   Physical Exam:  General: Alert, interactive, in no acute distress. Eyes: No conjunctival injection bilaterally, no discharge on the right, no discharge on the left and no Horner-Trantas dots present. PERRL bilaterally. EOMI without pain. No photophobia.  Ears: Right TM pearly gray with normal light reflex, Left TM pearly gray with normal light reflex, Right TM intact without perforation and Left TM intact without perforation.  Nose/Throat: External nose within normal limits and septum midline. Turbinates edematous and pale with clear discharge. Posterior oropharynx erythematous with cobblestoning in the posterior oropharynx. Tonsils 2+ without exudates.  Tongue without thrush. Lungs: Clear to auscultation without wheezing, rhonchi or rales. No increased work of  breathing. CV: Normal S1/S2. No murmurs. Capillary refill <2 seconds.  Skin: Warm and dry, without lesions or rashes. Neuro:   Grossly intact. No focal deficits appreciated. Responsive to questions.  Diagnostic studies:   Spirometry: results normal (FEV1: 2.77/100%, FVC: 3.41/98%, FEV1/FVC: 81%).    Spirometry consistent with normal pattern.   Allergy Studies: none    Salvatore Marvel, MD  [facility]

## 2018-01-11 NOTE — Patient Instructions (Addendum)
1. Mild intermittent asthma, uncomplicated - Lung testing looked great today. - We will change to Breo 100/25 one puff once daily instead of Qvar during the fall season. - Daily controller medication(s): Breo 100/25 one puff once daily during the fall season - Prior to physical activity: ProAir 2 puffs 10-15 minutes before physical activity. - Rescue medications: ProAir 4 puffs every 4-6 hours as needed - Changes during respiratory infections or worsening symptoms: Add on Qvar 73mcg to 4 puffs twice daily for TWO WEEKS. - Asthma control goals:  * Full participation in all desired activities (may need albuterol before activity) * Albuterol use two time or less a week on average (not counting use with activity) * Cough interfering with sleep two time or less a month * Oral steroids no more than once a year * No hospitalizations  2. Allergic rhinitis (ragweed, molds) - Start Nasonex aggressively in August or September and continue through the first frost. - Continue with Xyzal 5mg  once daily as needed.    3. 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 - We can consider venom immunotherapy in the future if needed.    4. Return in about 6 months (around 07/14/2018).   Please inform us of any Emergency Department visits, hospitalizations, or changes in symptoms. Call us before going to the ED for breathing or allergy symptoms since we might be able to fit you in for a sick visit. Feel free to contact us anytime with any questions, problems, or concerns.  It was a pleasure to see you again today!  Websites that have reliable patient information: 1. American Academy of Asthma, Allergy, and Immunology: www.aaaai.org 2. Food Allergy Research and Education (FARE): foodallergy.org 3. Mothers of Asthmatics: http://www.asthmacommunitynetwork.org 4. American College of Allergy, Asthma, and Immunology: www.acaai.org

## 2018-01-12 ENCOUNTER — Other Ambulatory Visit: Payer: Self-pay

## 2018-01-23 ENCOUNTER — Telehealth: Payer: Self-pay

## 2018-01-23 NOTE — Telephone Encounter (Signed)
[facility] called.  They have tried to call patient to get Auvi Q 0.3 mg mailed out. Patient is refusing to agree to their HIPPA because she cannot verify that this is [employer] calling her.  [employer] asked that we call patient on their behalf to let her know [employer] will call her to get information.    Called patient and left message on voice mail to let her know to call [employer] at [phone] to ok shipment on her Auvi Q 0.3 mg 2 pack.

## 2018-01-24 ENCOUNTER — Telehealth: Payer: Self-pay | Admitting: Allergy

## 2018-01-24 ENCOUNTER — Other Ambulatory Visit: Payer: Self-pay | Admitting: Allergy

## 2018-01-24 MED ORDER — EPINEPHRINE 0.3 MG/0.3ML IJ SOAJ: INTRAMUSCULAR | 2 refills | Status: DC

## 2018-01-24 NOTE — Telephone Encounter (Signed)
done

## 2018-01-24 NOTE — Telephone Encounter (Signed)
Patient called and said she didn't want the Auvi Q Faxed in the epi-pen mylan brand generic .

## 2018-07-09 DIAGNOSIS — D485 Neoplasm of uncertain behavior of skin: Secondary | ICD-10-CM | POA: Diagnosis not present

## 2018-07-09 DIAGNOSIS — D2261 Melanocytic nevi of right upper limb, including shoulder: Secondary | ICD-10-CM | POA: Diagnosis not present

## 2018-07-09 DIAGNOSIS — D2230 Melanocytic nevi of unspecified part of face: Secondary | ICD-10-CM | POA: Diagnosis not present

## 2018-07-09 DIAGNOSIS — D2271 Melanocytic nevi of right lower limb, including hip: Secondary | ICD-10-CM | POA: Diagnosis not present

## 2018-07-09 DIAGNOSIS — D2272 Melanocytic nevi of left lower limb, including hip: Secondary | ICD-10-CM | POA: Diagnosis not present

## 2018-07-20 ENCOUNTER — Ambulatory Visit: Payer: BLUE CROSS/BLUE SHIELD | Admitting: Allergy & Immunology

## 2018-08-23 ENCOUNTER — Ambulatory Visit (INDEPENDENT_AMBULATORY_CARE_PROVIDER_SITE_OTHER): Payer: BLUE CROSS/BLUE SHIELD | Admitting: Allergy & Immunology

## 2018-08-23 ENCOUNTER — Encounter: Payer: Self-pay | Admitting: Allergy & Immunology

## 2018-08-23 VITALS — BP 110/72 | HR 86 | Temp 98.2°F | Resp 16

## 2018-08-23 DIAGNOSIS — T63481A Toxic effect of venom of other arthropod, accidental (unintentional), initial encounter: Secondary | ICD-10-CM

## 2018-08-23 DIAGNOSIS — J4530 Mild persistent asthma, uncomplicated: Secondary | ICD-10-CM | POA: Diagnosis not present

## 2018-08-23 DIAGNOSIS — J3089 Other allergic rhinitis: Secondary | ICD-10-CM

## 2018-08-23 DIAGNOSIS — J302 Other seasonal allergic rhinitis: Secondary | ICD-10-CM

## 2018-08-23 MED ORDER — FLUTICASONE FUROATE-VILANTEROL 100-25 MCG/INH IN AEPB: 1.0000 | INHALATION_SPRAY | Freq: Every day | RESPIRATORY_TRACT | 5 refills | Status: DC

## 2018-08-23 MED ORDER — BECLOMETHASONE DIPROPIONATE 80 MCG/ACT IN AERS: 4.0000 | INHALATION_SPRAY | Freq: Two times a day (BID) | RESPIRATORY_TRACT | 5 refills | Status: DC | PRN

## 2018-08-23 MED ORDER — EPINEPHRINE 0.3 MG/0.3ML IJ SOAJ: INTRAMUSCULAR | 2 refills | Status: DC

## 2018-08-23 NOTE — Progress Notes (Signed)
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 presents for a follow-up visit.  She is doing very well with her asthma control.  She is on an unconventional dosing regimen, using Breo only during certain times of the year, but it seems to be doing well for her and has avoided the need for hospitalizations and prednisone.  We will not make any changes at this time.  There is no indication for any biologic addition.  Her allergic rhinitis is controlled with Nasonex used aggressively during the ragweed season and antihistamines as needed throughout the rest of the year.  She will continue to avoid stinging insects as tolerated.  We will update her EpiPen prescription.  Plan/Recommendations:   1. Mild intermittent asthma, uncomplicated - Lung testing looked great today. - Continue with Breo 100/25 one puff once daily during the fall season. - Daily controller medication(s): Breo 100/25 one puff once daily during the fall season - Prior to physical activity: ProAir 2 puffs 10-15 minutes before physical activity. - Rescue medications: ProAir 4 puffs every 4-6 hours as needed - Changes during respiratory infections or worsening symptoms: Add on Qvar 63mcg to 4 puffs twice daily for TWO WEEKS. - Asthma control goals:  * Full participation in all desired activities (may need albuterol before activity) * Albuterol use two time or less a week on average (not counting use with activity) * Cough interfering with sleep two time or less a month * Oral steroids no more than once a year * No hospitalizations  2. Allergic rhinitis (ragweed, molds) - Continue with Nasonex aggressively in August and continue  through the first frost. - Continue with Xyzal 5mg  once daily as needed. - We will send in refills.   3. 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 - We can consider venom immunotherapy in the future if needed.    4. Return in about 6 months (around 02/22/2019).  Subjective:   Cheryl Mata is a 47 y.o. female presenting today for follow up of  Chief Complaint  Patient presents with  . Asthma  . Ear Drainage    itching    Cheryl Mata has a history of the following: Patient Active Problem List   Diagnosis Date Noted  . 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 Other allergic rhinitis 11/20/2015  . Mild intermittent asthma without complication 01/75/1025  . History of insect sting allergy 11/20/2015    History obtained from: chart review and patient.  [name] Primary Care Provider is Corey Harold, MD.     Cheryl Mata is a 47 y.o. female presenting for a follow up visit.  She was last seen in May 2019.  At that time, she was doing very well.  Her lung testing looked great.  She was having increased symptoms in the fall, despite the use of her Qvar.  Therefore, we increased her to Breo 100/25 mcg 1 puff once daily.  She also was having worsening symptoms with regards to her ragweed allergy, so we recommended using a nasal steroid aggressively in August or September through the first frost and continuing with Xyzal as needed.  We did refill her EpiPen due to her history of stinging  insect anaphylaxis.  Since the last visit, she has done very well.  She is very happy with how her new anti-inflammatory regimen during the fall season.  Asthma/Respiratory Symptom History: She remains on Breo 100/25 1 puff once daily during the fall season.  She has now stopped this.  She started it around October.  She has required no prednisone, ER visits, or urgent care visits for her symptoms.  ACT score today is 21, indicating excellent asthma control.   Allergic Rhinitis  Symptom History: She did aggressively start her nasal steroid during the fall season.  This seems to have worked very well.  She has required no antibiotics since last visit.  She does endorse some left ear drainage over the last couple weeks, which is since resolved.  She thinks that this is more earwax rather than a purulent discharge from her middle ear.  She denies any fever.  She did not need any antibiotics.  She has had some issues during the Christmas season since she was taken out all of her Christmas decorations from the attic.  This calmed down over the course of a few days.  She has had no accidental stinging insect stings. Her EpiPen needs to be refilled.   Otherwise, there have been no changes to her past medical history, surgical history, family history, or social history.    Review of Systems: a 14-point review of systems is pertinent for what is mentioned in HPI.  Otherwise, all other systems were negative.  Constitutional: negative other than that listed in the HPI Eyes: negative other than that listed in the HPI Ears, nose, mouth, throat, and face: negative other than that listed in the HPI Respiratory: negative other than that listed in the HPI Cardiovascular: negative other than that listed in the HPI Gastrointestinal: negative other than that listed in the HPI Genitourinary: negative other than that listed in the HPI Integument: negative other than that listed in the HPI Hematologic: negative other than that listed in the HPI Musculoskeletal: negative other than that listed in the HPI Neurological: negative other than that listed in the HPI Allergy/Immunologic: negative other than that listed in the HPI    Objective:   Blood pressure 110/72, pulse 86, temperature 98.2 F (36.8 C), temperature source Oral, resp. rate 16, SpO2 98 %. There is no height or weight on file to calculate BMI.   Physical Exam:  General: Alert, interactive, in no acute distress. Pleasant  female.  2Eyes: No conjunctival injection bilaterally, no discharge on the right, no discharge on the left and no Horner-Trantas dots present. PERRL bilaterally. EOMI without pain. No photophobia.  Ears: Right TM pearly gray with normal light reflex, Left TM pearly gray with normal light reflex, Right TM intact without perforation and Left TM intact without perforation.  Nose/Throat: External nose within normal limits and septum midline. Turbinates edematous and pale with clear discharge. Posterior oropharynx erythematous without cobblestoning in the posterior oropharynx. Tonsils 2+ without exudates.  Tongue without thrush. Lungs: Clear to auscultation without wheezing, rhonchi or rales. No increased work of breathing. CV: Normal S1/S2. No murmurs. Capillary refill <2 seconds.  Skin: Warm and dry, without lesions or rashes. Neuro:   Grossly intact. No focal deficits appreciated. Responsive to questions.  Diagnostic studies:   Spirometry: results normal (FEV1: 2.55/92%, FVC: 3.10/89%, FEV1/FVC: 82%).    Spirometry consistent with normal pattern.  Allergy Studies: none       Salvatore Marvel, MD  [facility] [place]

## 2018-08-23 NOTE — Patient Instructions (Addendum)
1. Mild intermittent asthma, uncomplicated - Lung testing looked great today. - Continue with Breo 100/25 one puff once daily during the fall season. - Daily controller medication(s): Breo 100/25 one puff once daily during the fall season - Prior to physical activity: ProAir 2 puffs 10-15 minutes before physical activity. - Rescue medications: ProAir 4 puffs every 4-6 hours as needed - Changes during respiratory infections or worsening symptoms: Add on Qvar 86mcg to 4 puffs twice daily for TWO WEEKS. - Asthma control goals:  * Full participation in all desired activities (may need albuterol before activity) * Albuterol use two time or less a week on average (not counting use with activity) * Cough interfering with sleep two time or less a month * Oral steroids no more than once a year * No hospitalizations  2. Allergic rhinitis (ragweed, molds) - Continue with Nasonex aggressively in August and continue through the first frost. - Continue with Xyzal 5mg  once daily as needed. - We will send in refills.   3. 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 - We can consider venom immunotherapy in the future if needed.    4. Return in about 6 months (around 02/22/2019).   Please inform us of any Emergency Department visits, hospitalizations, or changes in symptoms. Call us before going to the ED for breathing or allergy symptoms since we might be able to fit you in for a sick visit. Feel free to contact us anytime with any questions, problems, or concerns.  It was a pleasure to see you again today! Happy holidays!!   Websites that have reliable patient information: 1. American Academy of Asthma, Allergy, and Immunology: www.aaaai.org 2. Food Allergy Research and Education (FARE): foodallergy.org 3. Mothers of Asthmatics: http://www.asthmacommunitynetwork.org 4. American College of Allergy, Asthma, and Immunology: www.acaai.org

## 2018-08-30 ENCOUNTER — Other Ambulatory Visit: Payer: Self-pay | Admitting: Radiology

## 2018-08-30 DIAGNOSIS — N6459 Other signs and symptoms in breast: Secondary | ICD-10-CM | POA: Diagnosis not present

## 2018-08-30 DIAGNOSIS — N6310 Unspecified lump in the right breast, unspecified quadrant: Secondary | ICD-10-CM

## 2018-08-31 DIAGNOSIS — D2371 Other benign neoplasm of skin of right lower limb, including hip: Secondary | ICD-10-CM | POA: Diagnosis not present

## 2018-09-04 ENCOUNTER — Other Ambulatory Visit: Payer: Self-pay | Admitting: Radiology

## 2018-09-04 ENCOUNTER — Ambulatory Visit
Admission: RE | Admit: 2018-09-04 | Discharge: 2018-09-04 | Disposition: A | Discharge status: Home / Self Care | Payer: BLUE CROSS/BLUE SHIELD | Source: Ambulatory Visit | Attending: Radiology | Admitting: Radiology

## 2018-09-04 DIAGNOSIS — N6310 Unspecified lump in the right breast, unspecified quadrant: Secondary | ICD-10-CM

## 2018-09-04 DIAGNOSIS — N6001 Solitary cyst of right breast: Secondary | ICD-10-CM

## 2018-09-04 DIAGNOSIS — R922 Inconclusive mammogram: Secondary | ICD-10-CM | POA: Diagnosis not present

## 2018-09-05 HISTORY — PX: CARPAL TUNNEL RELEASE: SHX101

## 2018-09-06 ENCOUNTER — Other Ambulatory Visit: Payer: Self-pay

## 2018-09-11 ENCOUNTER — Ambulatory Visit
Admission: RE | Admit: 2018-09-11 | Discharge: 2018-09-11 | Disposition: A | Discharge status: Home / Self Care | Payer: BLUE CROSS/BLUE SHIELD | Source: Ambulatory Visit | Attending: Radiology | Admitting: Radiology

## 2018-09-11 ENCOUNTER — Other Ambulatory Visit: Payer: Self-pay | Admitting: Radiology

## 2018-09-11 DIAGNOSIS — N6001 Solitary cyst of right breast: Secondary | ICD-10-CM | POA: Diagnosis not present

## 2018-09-11 DIAGNOSIS — N6310 Unspecified lump in the right breast, unspecified quadrant: Secondary | ICD-10-CM

## 2018-09-28 DIAGNOSIS — Z1212 Encounter for screening for malignant neoplasm of rectum: Secondary | ICD-10-CM | POA: Diagnosis not present

## 2018-09-28 DIAGNOSIS — Z1231 Encounter for screening mammogram for malignant neoplasm of breast: Secondary | ICD-10-CM | POA: Diagnosis not present

## 2018-09-28 DIAGNOSIS — Z8041 Family history of malignant neoplasm of ovary: Secondary | ICD-10-CM | POA: Diagnosis not present

## 2018-09-28 DIAGNOSIS — Z803 Family history of malignant neoplasm of breast: Secondary | ICD-10-CM | POA: Diagnosis not present

## 2018-09-28 DIAGNOSIS — Z808 Family history of malignant neoplasm of other organs or systems: Secondary | ICD-10-CM | POA: Diagnosis not present

## 2018-09-28 DIAGNOSIS — Z01419 Encounter for gynecological examination (general) (routine) without abnormal findings: Secondary | ICD-10-CM | POA: Diagnosis not present

## 2018-09-28 DIAGNOSIS — Z6822 Body mass index (BMI) 22.0-22.9, adult: Secondary | ICD-10-CM | POA: Diagnosis not present

## 2018-11-09 DIAGNOSIS — Z809 Family history of malignant neoplasm, unspecified: Secondary | ICD-10-CM | POA: Diagnosis not present

## 2018-11-13 ENCOUNTER — Other Ambulatory Visit: Payer: Self-pay | Admitting: Obstetrics and Gynecology

## 2018-11-13 DIAGNOSIS — Z809 Family history of malignant neoplasm, unspecified: Secondary | ICD-10-CM

## 2018-11-22 DIAGNOSIS — D2272 Melanocytic nevi of left lower limb, including hip: Secondary | ICD-10-CM | POA: Diagnosis not present

## 2018-11-22 DIAGNOSIS — Z86018 Personal history of other benign neoplasm: Secondary | ICD-10-CM | POA: Diagnosis not present

## 2018-11-22 DIAGNOSIS — D2230 Melanocytic nevi of unspecified part of face: Secondary | ICD-10-CM | POA: Diagnosis not present

## 2018-11-22 DIAGNOSIS — L814 Other melanin hyperpigmentation: Secondary | ICD-10-CM | POA: Diagnosis not present

## 2018-11-23 ENCOUNTER — Other Ambulatory Visit: Payer: BLUE CROSS/BLUE SHIELD

## 2018-12-25 ENCOUNTER — Other Ambulatory Visit: Payer: BLUE CROSS/BLUE SHIELD

## 2019-02-01 ENCOUNTER — Other Ambulatory Visit: Payer: Self-pay | Admitting: *Deleted

## 2019-02-01 MED ORDER — LEVOCETIRIZINE DIHYDROCHLORIDE 5 MG PO TABS: 5.0000 mg | ORAL_TABLET | Freq: Every evening | ORAL | 0 refills | Status: DC

## 2019-02-07 ENCOUNTER — Other Ambulatory Visit: Payer: Self-pay

## 2019-02-07 ENCOUNTER — Ambulatory Visit
Admission: RE | Admit: 2019-02-07 | Discharge: 2019-02-07 | Disposition: A | Discharge status: Home / Self Care | Payer: BLUE CROSS/BLUE SHIELD | Source: Ambulatory Visit | Attending: Obstetrics and Gynecology | Admitting: Obstetrics and Gynecology

## 2019-02-07 DIAGNOSIS — Z1239 Encounter for other screening for malignant neoplasm of breast: Secondary | ICD-10-CM | POA: Diagnosis not present

## 2019-02-07 DIAGNOSIS — Z809 Family history of malignant neoplasm, unspecified: Secondary | ICD-10-CM

## 2019-02-07 MED ORDER — GADOBUTROL 1 MMOL/ML IV SOLN
6.0000 mL | Freq: Once | INTRAVENOUS | Status: AC | PRN
  Administered 2019-02-07: 15:00:00 6 mL via INTRAVENOUS

## 2019-02-18 DIAGNOSIS — K5909 Other constipation: Secondary | ICD-10-CM | POA: Diagnosis not present

## 2019-02-18 DIAGNOSIS — K602 Anal fissure, unspecified: Secondary | ICD-10-CM | POA: Diagnosis not present

## 2019-02-26 ENCOUNTER — Other Ambulatory Visit: Payer: Self-pay

## 2019-02-26 ENCOUNTER — Ambulatory Visit (INDEPENDENT_AMBULATORY_CARE_PROVIDER_SITE_OTHER): Payer: BC Managed Care – PPO | Admitting: Allergy & Immunology

## 2019-02-26 ENCOUNTER — Encounter: Payer: Self-pay | Admitting: Allergy & Immunology

## 2019-02-26 VITALS — BP 112/70 | HR 84 | Temp 98.1°F | Resp 16 | Ht 63.5 in | Wt 125.0 lb

## 2019-02-26 DIAGNOSIS — J3089 Other allergic rhinitis: Secondary | ICD-10-CM | POA: Diagnosis not present

## 2019-02-26 DIAGNOSIS — J302 Other seasonal allergic rhinitis: Secondary | ICD-10-CM | POA: Diagnosis not present

## 2019-02-26 DIAGNOSIS — T63481A Toxic effect of venom of other arthropod, accidental (unintentional), initial encounter: Secondary | ICD-10-CM

## 2019-02-26 DIAGNOSIS — J4530 Mild persistent asthma, uncomplicated: Secondary | ICD-10-CM

## 2019-02-26 NOTE — Progress Notes (Signed)
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 Mild intermittent asthma, uncomplicated - Lung testing deferred today since this can spread the coronavirus.  - Daily controller medication(s): Breo 100/25 one puff once daily during the fall season  - Prior to physical activity: ProAir 2 puffs 10-15 minutes before physical activity. - Rescue medications: ProAir 4 puffs every 4-6 hours as needed - Changes during respiratory infections or worsening symptoms: Add on Qvar 59mcg to 4 puffs twice daily for TWO WEEKS. - Asthma control goals:  * Full participation in all desired activities (may need albuterol before activity) * Albuterol use two time or less a week on average (not counting use with activity) * Cough interfering with sleep two time or less a month * Oral steroids no more than once a year * No hospitalizations  2. Allergic rhinitis (grasses, weeds, trees, indoor molds, dust mites, cat, dog, and horse) - Continue with Nasonex aggressively in August and continue through the first frost. - Continue with Xyzal 5mg  once daily as needed. - We will send in refills.   3. 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 - We can consider venom immunotherapy in the future if needed.    4. Return in about 6 months (around 08/28/2019).  Subjective:   Cheryl Mata is a 48 y.o. female presenting today for follow up of  Chief Complaint  Patient presents with  . Asthma    Cheryl Mata has a history of the following: Patient Active Problem List   Diagnosis Date Noted  . Anaphylaxis due  to insect venom 06/09/2017  . Other allergic rhinitis 11/20/2015  . Mild intermittent asthma without complication 92/07/9416  . History of insect sting allergy 11/20/2015    History obtained from: chart review and patient.  Cheryl Mata is a 48 y.o. female presenting for a follow up visit.  She was last seen in December 2019.  At that time, her lung testing looked great.  We continued Breo 100/25 mcg 1 puff once daily during the fall season only.  She has albuterol to use as needed.  For her allergic rhinitis, we continue with Nasonex aggressively in August through the first frost.  We also continued with Xyzal 5 mg daily as needed.  She has a history of anaphylaxis to stinging insects.  We refilled her EpiPen.  Since the last visit, she has been mostly well. Normally she does not have that many problems with her allergies in the spring. Her asthma is typically in the fall/winter and her allergies are typically in the fall as well. She did have some problems with allergies in April/May. She did add on the [facility] for a few weeks and she added on the Xyzal. She remained on the [facility] for three months or so. She stopped using it around three weeks. She did not need prednisone at all.  She has not required any emergency room visits or urgent care visits for her asthma.  ACT score is 19, indicating excellent asthma control.  She does note that there is a certain conference room at work where her symptoms are triggered.  However, they  are not meeting in the conference room anymore due to [place], so it is been less of an issue.  Her allergic rhinitis is controlled with Nasonex typically given through August and the first frost.  Ragweed season only seems to be the worst time for her.  However, this year things have been a little more irritating during the spring and summer seasons.  She has not required any antibiotics. Her last testing was positive to grasses, weeds, trees, indoor molds, dust mites, cat, dog, and horse  .  Her last testing was in 2013:     She continues to work from [employer].  Apparently, [employer] is emerging now with [employer] to become [employer].  The merger is occurring by October.  Thankfully, she will not be losing her job.  Otherwise, there have been no changes to her past medical history, surgical history, family history, or social history.    Review of Systems  Constitutional: Negative.  Negative for chills, fever, malaise/fatigue and weight loss.  HENT: Negative.  Negative for congestion, ear discharge, ear pain and sore throat.   Eyes: Negative for pain, discharge and redness.  Respiratory: Negative for cough, sputum production, shortness of breath and wheezing.   Cardiovascular: Negative.  Negative for chest pain and palpitations.  Gastrointestinal: Negative for abdominal pain, heartburn, nausea and vomiting.  Skin: Negative.  Negative for itching and rash.  Neurological: Negative for dizziness and headaches.  Endo/Heme/Allergies: Negative for environmental allergies. Does not bruise/bleed easily.       Objective:   Blood pressure 112/70, pulse 84, temperature 98.1 F (36.7 C), temperature source Temporal, resp. rate 16, height 5' 3.5" (1.613 m), weight 125 lb (56.7 kg), SpO2 98 %. Body mass index is 21.8 kg/m.   Physical Exam:  Physical Exam  Constitutional: She appears well-developed.  Pleasant female. Talkative.   HENT:  Head: Normocephalic and atraumatic.  Right Ear: Tympanic membrane, external ear and ear canal normal.  Left Ear: Tympanic membrane, external ear and ear canal normal.  Nose: Mucosal edema and rhinorrhea present. No nasal deformity or septal deviation. No epistaxis. Right sinus exhibits no maxillary sinus tenderness and no frontal sinus tenderness. Left sinus exhibits no maxillary sinus tenderness and no frontal sinus tenderness.  Mouth/Throat: Uvula is midline and oropharynx is clear and moist. Mucous membranes are not pale and not dry.  Cobblestoning  present in the posterior oropharynx.   Eyes: Pupils are equal, round, and reactive to light. Conjunctivae and EOM are normal. Right eye exhibits no chemosis and no discharge. Left eye exhibits no chemosis and no discharge. Right conjunctiva is not injected. Left conjunctiva is not injected.  Cardiovascular: Normal rate, regular rhythm and normal heart sounds.  Respiratory: Effort normal and breath sounds normal. No accessory muscle usage. No tachypnea. No respiratory distress. She has no wheezes. She has no rhonchi. She has no rales. She exhibits no tenderness.  Lymphadenopathy:    She has no cervical adenopathy.  Neurological: She is alert.  Skin: No abrasion, no petechiae and no rash noted. Rash is not papular, not vesicular and not urticarial. No erythema. No pallor.  Psychiatric: She has a normal mood and affect.     Diagnostic studies: none       Salvatore Marvel, MD  Allergy and [facility] [place]

## 2019-02-26 NOTE — Patient Instructions (Addendum)
1. Mild intermittent asthma, uncomplicated - Lung testing deferred today since this can spread the coronavirus.  - Daily controller medication(s): Breo 100/25 one puff once daily during the fall season  - Prior to physical activity: ProAir 2 puffs 10-15 minutes before physical activity. - Rescue medications: ProAir 4 puffs every 4-6 hours as needed - Changes during respiratory infections or worsening symptoms: Add on Qvar 75mcg to 4 puffs twice daily for TWO WEEKS. - Asthma control goals:  * Full participation in all desired activities (may need albuterol before activity) * Albuterol use two time or less a week on average (not counting use with activity) * Cough interfering with sleep two time or less a month * Oral steroids no more than once a year * No hospitalizations  2. Allergic rhinitis (grasses, weeds, trees, indoor molds, dust mites, cat, dog, and horse) - Continue with Nasonex aggressively in August and continue through the first frost. - Continue with Xyzal 5mg  once daily as needed. - We will send in refills.   3. 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 - We can consider venom immunotherapy in the future if needed.    4. Return in about 6 months (around 08/28/2019).   Please inform us of any Emergency Department visits, hospitalizations, or changes in symptoms. Call us before going to the ED for breathing or allergy symptoms since we might be able to fit you in for a sick visit. Feel free to contact us anytime with any questions, problems, or concerns.  It was a pleasure to see you again today!    Websites that have reliable patient information: 1. American Academy of Asthma, Allergy, and Immunology: www.aaaai.org 2. Food Allergy Research and Education (FARE): foodallergy.org 3. Mothers of Asthmatics: http://www.asthmacommunitynetwork.org 4. American College of Allergy, Asthma, and Immunology: www.acaai.org

## 2019-02-27 ENCOUNTER — Encounter: Payer: Self-pay | Admitting: Allergy & Immunology

## 2019-03-04 ENCOUNTER — Other Ambulatory Visit: Payer: Self-pay | Admitting: *Deleted

## 2019-03-04 MED ORDER — LEVOCETIRIZINE DIHYDROCHLORIDE 5 MG PO TABS: 5.0000 mg | ORAL_TABLET | Freq: Every evening | ORAL | 5 refills | Status: DC

## 2019-06-18 DIAGNOSIS — G5603 Carpal tunnel syndrome, bilateral upper limbs: Secondary | ICD-10-CM | POA: Diagnosis not present

## 2019-07-02 DIAGNOSIS — G5603 Carpal tunnel syndrome, bilateral upper limbs: Secondary | ICD-10-CM | POA: Diagnosis not present

## 2019-07-08 DIAGNOSIS — G5603 Carpal tunnel syndrome, bilateral upper limbs: Secondary | ICD-10-CM | POA: Diagnosis not present

## 2019-07-22 ENCOUNTER — Other Ambulatory Visit: Payer: Self-pay | Admitting: *Deleted

## 2019-07-22 DIAGNOSIS — Z20822 Contact with and (suspected) exposure to covid-19: Secondary | ICD-10-CM

## 2019-07-25 LAB — NOVEL CORONAVIRUS, NAA: SARS-CoV-2, NAA: NOT DETECTED

## 2019-08-08 DIAGNOSIS — G5601 Carpal tunnel syndrome, right upper limb: Secondary | ICD-10-CM | POA: Diagnosis not present

## 2019-08-19 DIAGNOSIS — M25631 Stiffness of right wrist, not elsewhere classified: Secondary | ICD-10-CM | POA: Diagnosis not present

## 2019-08-27 ENCOUNTER — Ambulatory Visit: Payer: BC Managed Care – PPO | Admitting: Allergy & Immunology

## 2019-09-03 ENCOUNTER — Encounter: Payer: Self-pay | Admitting: Allergy & Immunology

## 2019-09-03 ENCOUNTER — Other Ambulatory Visit: Payer: Self-pay

## 2019-09-03 ENCOUNTER — Ambulatory Visit (INDEPENDENT_AMBULATORY_CARE_PROVIDER_SITE_OTHER): Payer: BC Managed Care – PPO | Admitting: Allergy & Immunology

## 2019-09-03 VITALS — BP 130/82 | HR 76 | Temp 97.6°F | Resp 16 | Ht 63.0 in | Wt 125.0 lb

## 2019-09-03 DIAGNOSIS — T63481A Toxic effect of venom of other arthropod, accidental (unintentional), initial encounter: Secondary | ICD-10-CM | POA: Diagnosis not present

## 2019-09-03 DIAGNOSIS — J4530 Mild persistent asthma, uncomplicated: Secondary | ICD-10-CM | POA: Diagnosis not present

## 2019-09-03 DIAGNOSIS — J302 Other seasonal allergic rhinitis: Secondary | ICD-10-CM | POA: Diagnosis not present

## 2019-09-03 DIAGNOSIS — J3089 Other allergic rhinitis: Secondary | ICD-10-CM

## 2019-09-03 MED ORDER — AZELASTINE-FLUTICASONE 137-50 MCG/ACT NA SUSP: NASAL | 5 refills | Status: DC

## 2019-09-03 MED ORDER — EPINEPHRINE 0.3 MG/0.3ML IJ SOAJ: 0.3000 mg | Freq: Once | INTRAMUSCULAR | 2 refills | Status: AC

## 2019-09-03 MED ORDER — ARNUITY ELLIPTA 100 MCG/ACT IN AEPB: 1.0000 | INHALATION_SPRAY | Freq: Every day | RESPIRATORY_TRACT | 5 refills | Status: DC

## 2019-09-03 MED ORDER — ALBUTEROL SULFATE HFA 108 (90 BASE) MCG/ACT IN AERS
2.0000 | INHALATION_SPRAY | RESPIRATORY_TRACT | 1 refills | Status: AC | PRN
Start: 2019-09-03 — End: ?

## 2019-09-03 MED ORDER — LEVOCETIRIZINE DIHYDROCHLORIDE 5 MG PO TABS: 5.0000 mg | ORAL_TABLET | Freq: Every evening | ORAL | 5 refills | Status: DC

## 2019-09-03 NOTE — Patient Instructions (Addendum)
1. Mild persistent asthma, uncomplicated - Lung testing looked decent today. - Since you have been off of Breo for so long, we are going to try to decrease to Arnuity 155mcg once puff once daily instead (Arnuity is the type of inhaled steroid in Breo) - Daily controller medication(s): Arnuity 131mcg one puff once daily during the fall season  - Prior to physical activity: ProAir 2 puffs 10-15 minutes before physical activity. - Rescue medications: ProAir 4 puffs every 4-6 hours as needed - Changes during respiratory infections or worsening symptoms: Increase Arnuity 133mcg to 1 puff twice daily for TWO WEEKS. - Asthma control goals:  * Full participation in all desired activities (may need albuterol before activity) * Albuterol use two time or less a week on average (not counting use with activity) * Cough interfering with sleep two time or less a month * Oral steroids no more than once a year * No hospitalizations  2. Allergic rhinitis (grasses, weeds, trees, indoor molds, dust mites, cat, dog, and horse) - We will change you to Dymista since this will be cheaper.   - Continue with Xyzal 5mg  once daily as needed. - We will send in refills.   3. 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 - We can consider venom immunotherapy in the future if needed.    4. Return in about 6 months (around 03/03/2020). This can be an in-person, a virtual Webex or a telephone follow up visit.   Please inform us of any Emergency Department visits, hospitalizations, or changes in symptoms. Call us before going to the ED for breathing or allergy symptoms since we might be able to fit you in for a sick visit. Feel free to contact us anytime with any questions, problems, or concerns.  It was a pleasure to see you again today!  Websites that have reliable patient information: 1. American Academy of Asthma, Allergy, and Immunology: www.aaaai.org 2. Food Allergy Research and Education (FARE):  foodallergy.org 3. Mothers of Asthmatics: http://www.asthmacommunitynetwork.org 4. American College of Allergy, Asthma, and Immunology: www.acaai.org  "Like" [place] on Facebook and Instagram for our latest updates!        Make sure you are registered to vote! If you have moved or changed any of your contact information, you will need to get this updated before voting!  In some cases, you MAY be able to register to vote online: [email or web address]

## 2019-09-03 NOTE — Progress Notes (Signed)
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 Mild persistent asthma, uncomplicated - Lung testing looked decent today. - Since you have been off of Breo for so long, we are going to try to decrease to Arnuity 126mcg once puff once daily instead (Arnuity is the type of inhaled steroid in Breo) - Daily controller medication(s): Arnuity 127mcg one puff once daily during the fall season  - Prior to physical activity: ProAir 2 puffs 10-15 minutes before physical activity. - Rescue medications: ProAir 4 puffs every 4-6 hours as needed - Changes during respiratory infections or worsening symptoms: Increase Arnuity 168mcg to 1 puff twice daily for TWO WEEKS. - Asthma control goals:  * Full participation in all desired activities (may need albuterol before activity) * Albuterol use two time or less a week on average (not counting use with activity) * Cough interfering with sleep two time or less a month * Oral steroids no more than once a year * No hospitalizations  2. Allergic rhinitis (grasses, weeds, trees, indoor molds, dust mites, cat, dog, and horse) - We will change you to Dymista since this will be cheaper.   - Continue with Xyzal 5mg  once daily as needed. - We will send in refills.   3. 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 - We can consider venom immunotherapy in the future if needed.    4. Return in about 6 months (around 03/03/2020). This can be an in-person, a virtual Webex or a telephone follow up visit.  Subjective:   Cheryl Mata is a 48 y.o. female presenting today for follow up of  Chief Complaint  Patient presents with  . Asthma    NO ISSUES     Cheryl Mata has a history of the following: Patient Active Problem List   Diagnosis Date Noted  . 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 Other allergic rhinitis 11/20/2015  . Mild intermittent asthma without complication [id number]  . History of insect sting allergy 11/20/2015    History obtained from: chart review and patient.  Cheryl Mata is a 48 y.o. female presenting for a follow up visit. She was last seen in June 2020.  At that time, we continued with Breo 100/25 mcg 1 puff once daily in combination with albuterol 4 puffs every 4-6 hours as needed.  For her allergic rhinitis, we continued with Nasonex aggressively in August and continuing through the first frost.  We will continue with Xyzal 5 mg once daily.  We also refilled her EpiPen for anaphylaxis to insect venom.  Since the last visit, she has mostly done well. [employer] recently merged with [employer] to create [employer]. She is fortunately keeping her job.   Asthma/Respiratory Symptom History: She remains well on Breo one puff once daily. She has done well with the masks. She has not been using her Memory Dance since the first of December. She had surgery at the beginning of December and just never restarted the medication.  She has not needed prednisone in quite some time.  She was previously on an inhaled steroid only, Qvar, at one point before we changed her to [facility].  She does like the once daily option of Breo.  Allergic Rhinitis Symptom History:  There was a week in the end of November where she had a few days of some congestion. She did focus with Nasonex in the fall season.  She is open to changing to [place].  Apparently she gets premedications if they are owned by [employer].  She has not required any antibiotics at all since last visit.  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  Otherwise, there have been no changes to her past medical history, surgical history, family history, or social history.    Review of  Systems  Constitutional: Negative.  Negative for chills, fever, malaise/fatigue and weight loss.  HENT: Negative.  Negative for congestion, ear discharge, ear pain and sore throat.   Eyes: Negative for pain, discharge and redness.  Respiratory: Negative for cough, sputum production, shortness of breath and wheezing.   Cardiovascular: Negative.  Negative for chest pain and palpitations.  Gastrointestinal: Negative for abdominal pain, constipation, diarrhea, heartburn, nausea and vomiting.  Skin: Negative.  Negative for itching and rash.  Neurological: Negative for dizziness and headaches.  Endo/Heme/Allergies: Negative for environmental allergies. Does not bruise/bleed easily.       Objective:   Blood pressure 130/82, pulse 76, temperature 97.6 F (36.4 C), temperature source Temporal, resp. rate 16, height 5\' 3"  (1.6 m), weight 125 lb (56.7 kg), SpO2 98 %. Body mass index is 22.14 kg/m.   Physical Exam:  Physical Exam  Constitutional: She appears well-developed.  Talkative female.  HENT:  Head: Normocephalic and atraumatic.  Right Ear: Tympanic membrane, external ear and ear canal normal.  Left Ear: Tympanic membrane, external ear and ear canal normal.  Nose: Mucosal edema and rhinorrhea present. No nasal deformity or septal deviation. No epistaxis. Right sinus exhibits no maxillary sinus tenderness and no frontal sinus tenderness. Left sinus exhibits no maxillary sinus tenderness and no frontal sinus tenderness.  Mouth/Throat: Uvula is midline and oropharynx is clear and moist. Mucous membranes are not pale and not dry.  Tonsils unremarkable.  Bilateral TM scarring.  Eyes: Pupils are equal, round, and reactive to light. Conjunctivae and EOM are normal. Right eye exhibits no chemosis and no discharge. Left eye exhibits no chemosis and no discharge. Right conjunctiva is not injected. Left conjunctiva is not injected.  Cardiovascular: Normal rate, regular rhythm and normal heart  sounds.  Respiratory: Effort normal and breath sounds normal. No accessory muscle usage. No tachypnea. No respiratory distress. She has no wheezes. She has no rhonchi. She has no rales. She exhibits no tenderness.  Moving air well in all lung fields.  No increased work of breathing.  Lymphadenopathy:    She has no cervical adenopathy.  Neurological: She is alert.  Skin: No abrasion, no petechiae and no rash noted. Rash is not papular, not vesicular and not urticarial. No erythema. No pallor.  Psychiatric: She has a normal mood and affect.     Diagnostic studies:    Spirometry: results normal (FEV1: 2.58/93%, FVC: 3.23/94%, FEV1/FVC: 80%).    Spirometry consistent with normal pattern.   Allergy Studies: none       Salvatore Marvel, MD  [facility] [place]

## 2019-09-30 DIAGNOSIS — Z6821 Body mass index (BMI) 21.0-21.9, adult: Secondary | ICD-10-CM | POA: Diagnosis not present

## 2019-09-30 DIAGNOSIS — Z01419 Encounter for gynecological examination (general) (routine) without abnormal findings: Secondary | ICD-10-CM | POA: Diagnosis not present

## 2019-09-30 DIAGNOSIS — Z1231 Encounter for screening mammogram for malignant neoplasm of breast: Secondary | ICD-10-CM | POA: Diagnosis not present

## 2020-01-26 ENCOUNTER — Other Ambulatory Visit: Payer: Self-pay

## 2020-01-26 ENCOUNTER — Emergency Department (INDEPENDENT_AMBULATORY_CARE_PROVIDER_SITE_OTHER)
Admission: EM | Admit: 2020-01-26 | Discharge: 2020-01-26 | Disposition: A | Discharge status: Home / Self Care | Payer: BLUE CROSS/BLUE SHIELD | Source: Home / Self Care | Attending: Family Medicine | Admitting: Family Medicine

## 2020-01-26 DIAGNOSIS — L247 Irritant contact dermatitis due to plants, except food: Secondary | ICD-10-CM | POA: Diagnosis not present

## 2020-01-26 DIAGNOSIS — L03113 Cellulitis of right upper limb: Secondary | ICD-10-CM

## 2020-01-26 DIAGNOSIS — L03114 Cellulitis of left upper limb: Secondary | ICD-10-CM

## 2020-01-26 MED ORDER — DOXYCYCLINE HYCLATE 100 MG PO CAPS: 100.0000 mg | ORAL_CAPSULE | Freq: Two times a day (BID) | ORAL | 0 refills | Status: DC

## 2020-01-26 MED ORDER — PREDNISONE 20 MG PO TABS: ORAL_TABLET | ORAL | 0 refills | Status: DC

## 2020-01-26 NOTE — Discharge Instructions (Addendum)
May take Benadryl at bedtime as needed for itching.

## 2020-01-26 NOTE — ED Triage Notes (Signed)
Pt c/o rash possibly caused by poison ivy x 1 week after doing yardwork. Tried calamine and OTC cream.

## 2020-01-26 NOTE — ED Provider Notes (Signed)
[facility]    CSN: [id number] Arrival date & time: 01/26/20  1403      History   Chief Complaint Chief Complaint  Patient presents with  . Rash    Poison Ivy?    HPI Cheryl Mata is a 49 y.o. female.   Patient contacted poison ivy on bath arms one week ago.  She now has an area on each forearm that is oozing fluid and painful.  Some rash is now spreading to her chest and back.  She feels well otherwise.   Poison Cheryl Mata This is a new problem. Episode onset: one week ago. The problem occurs constantly. The problem has been gradually worsening. Nothing aggravates the symptoms. Nothing relieves the symptoms. Treatments tried: calamine. The treatment provided no relief.    Past Medical History:  Diagnosis Date  . Asthma     Patient Active Problem List   Diagnosis Date Noted  . 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 Other allergic rhinitis 11/20/2015  . Mild intermittent asthma without complication [id number]  . History of insect sting allergy 11/20/2015    Past Surgical History:  Procedure Laterality Date  . ADENOIDECTOMY    . BREAST BIOPSY    . TONSILLECTOMY    . TYMPANOSTOMY TUBE PLACEMENT      OB History   No obstetric history on file.      Home Medications    Prior to Admission medications   Medication Sig Start Date End Date Taking? Authorizing Provider  albuterol (PROAIR HFA) 108 (90 Base) MCG/ACT inhaler Inhale 2 puffs into the lungs every 4 (four) hours as needed for wheezing or shortness of breath. 01/11/18   Valentina Shaggy, MD  albuterol (VENTOLIN HFA) 108 (90 Base) MCG/ACT inhaler Inhale 2 puffs into the lungs every 4 (four) hours as needed. 09/03/19   Valentina Shaggy, MD  Azelastine-Fluticasone [facility]) [phone] MCG/ACT SUSP 1-2 sprays each nostril BID, prn 09/03/19   Valentina Shaggy, MD  doxycycline (VIBRAMYCIN) 100 MG capsule Take 1 capsule (100 mg total) by mouth 2 (two) times daily. Take with food. 01/26/20    Kandra Nicolas, MD  EPINEPHrine (EPIPEN 2-PAK) 0.3 mg/0.3 mL IJ SOAJ injection Use as directed for severe allergic reaction 08/23/18   Valentina Shaggy, MD  Fluticasone Furoate (ARNUITY ELLIPTA) 100 MCG/ACT AEPB Inhale 1 Dose into the lungs daily. 09/03/19   Valentina Shaggy, MD  fluticasone furoate-vilanterol (BREO ELLIPTA) 100-25 MCG/INH AEPB Inhale 1 puff into the lungs daily. Patient not taking: Reported on 09/03/2019 08/23/18   Valentina Shaggy, MD  levocetirizine (XYZAL) 5 MG tablet Take 1 tablet (5 mg total) by mouth every evening. Patient not taking: Reported on 09/03/2019 03/04/19   Valentina Shaggy, MD  levocetirizine (XYZAL) 5 MG tablet Take 1 tablet (5 mg total) by mouth every evening. 09/03/19   Valentina Shaggy, MD  mometasone (NASONEX) 50 MCG/ACT nasal spray Place 2 sprays into the nose daily. Reported on 11/20/2015 Patient not taking: Reported on 09/03/2019 11/20/15   Leda Roys, MD  predniSONE (DELTASONE) 20 MG tablet Take one tab by mouth twice daily for 4 days, then one daily. Take with food. 01/26/20   Kandra Nicolas, MD    Family History Family History  Problem Relation Age of Onset  . Breast cancer Mother 70  . Hypertension Mother   . Hypertension Father     Social History Social History   Tobacco Use  . Smoking status: Never Smoker  .  Smokeless tobacco: Never Used  Substance Use Topics  . Alcohol use: Yes  . Drug use: No     Allergies   Yellow jacket venom [bee venom]   Review of Systems Review of Systems  Constitutional: Negative for activity change, chills, diaphoresis, fatigue and fever.  Skin: Positive for color change and rash.  All other systems reviewed and are negative.    Physical Exam Triage Vital Signs ED Triage Vitals  Enc Vitals Group     BP 01/26/20 1412 133/74     Pulse Rate 01/26/20 1412 79     Resp 01/26/20 1412 18     Temp 01/26/20 1412 97.9 F (36.6 C)     Temp Source 01/26/20 1412 Oral      SpO2 01/26/20 1412 98 %     Weight --      Height --      Head Circumference --      Peak Flow --      Pain Score 01/26/20 1413 2     Pain Loc --      Pain Edu? --      Excl. in [place]? --    No data found.  Updated Vital Signs BP 133/74 (BP Location: Right Arm)   Pulse 79   Temp 97.9 F (36.6 C) (Oral)   Resp 18   SpO2 98%   Visual Acuity Right Eye Distance:   Left Eye Distance:   Bilateral Distance:    Right Eye Near:   Left Eye Near:    Bilateral Near:     Physical Exam Vitals and nursing note reviewed.  Constitutional:      General: She is not in acute distress. HENT:     Head: Normocephalic.     Right Ear: External ear normal.     Left Ear: External ear normal.     Nose: Nose normal.     Mouth/Throat:     Pharynx: Oropharynx is clear.  Eyes:     Pupils: Pupils are equal, round, and reactive to light.  Cardiovascular:     Rate and Rhythm: Normal rate.  Pulmonary:     Effort: Pulmonary effort is normal.  Skin:    General: Skin is warm and dry.          Comments: Moist weeping patches on each forearm with surrounding erythema.  Scattered 1 to 29mm maculopapular erythematous lesions on trunk and arms.  Neurological:     Mental Status: She is alert.      UC Treatments / Results  Labs (all labs ordered are listed, but only abnormal results are displayed) Labs Reviewed - No data to display  EKG   Radiology No results found.  Procedures Procedures (including critical care time)  Medications Ordered in UC Medications - No data to display  Initial Impression / Assessment and Plan / UC Course  I have reviewed the triage vital signs and the nursing notes.  Pertinent labs & imaging results that were available during my care of the patient were reviewed by me and considered in my medical decision making (see chart for details).    Suspect secondary staph cellulitis.  Begin doxycycline and prednisone burst/taper. Followup with Family Doctor if not  improved in about 5 days.  Final Clinical Impressions(s) / UC Diagnoses   Final diagnoses:  Irritant contact dermatitis due to plants, except food  Cellulitis of left arm  Cellulitis of right arm     Discharge Instructions     May take  Benadryl at bedtime as needed for itching.    ED Prescriptions    Medication Sig Dispense Auth. Provider   predniSONE (DELTASONE) 20 MG tablet Take one tab by mouth twice daily for 4 days, then one daily. Take with food. 12 tablet Kandra Nicolas, MD   doxycycline (VIBRAMYCIN) 100 MG capsule Take 1 capsule (100 mg total) by mouth 2 (two) times daily. Take with food. 14 capsule Kandra Nicolas, MD        Kandra Nicolas, MD 01/27/20 [phone]

## 2020-02-27 DIAGNOSIS — D2272 Melanocytic nevi of left lower limb, including hip: Secondary | ICD-10-CM | POA: Diagnosis not present

## 2020-02-27 DIAGNOSIS — D2230 Melanocytic nevi of unspecified part of face: Secondary | ICD-10-CM | POA: Diagnosis not present

## 2020-02-27 DIAGNOSIS — L814 Other melanin hyperpigmentation: Secondary | ICD-10-CM | POA: Diagnosis not present

## 2020-02-27 DIAGNOSIS — Z86018 Personal history of other benign neoplasm: Secondary | ICD-10-CM | POA: Diagnosis not present

## 2020-03-03 ENCOUNTER — Ambulatory Visit: Payer: BC Managed Care – PPO | Admitting: Allergy & Immunology

## 2020-03-19 ENCOUNTER — Other Ambulatory Visit: Payer: Self-pay

## 2020-03-19 ENCOUNTER — Encounter: Payer: Self-pay | Admitting: Allergy & Immunology

## 2020-03-19 ENCOUNTER — Ambulatory Visit (INDEPENDENT_AMBULATORY_CARE_PROVIDER_SITE_OTHER): Payer: BC Managed Care – PPO | Admitting: Allergy & Immunology

## 2020-03-19 VITALS — BP 110/62 | HR 64 | Temp 98.1°F | Resp 16

## 2020-03-19 DIAGNOSIS — J302 Other seasonal allergic rhinitis: Secondary | ICD-10-CM | POA: Diagnosis not present

## 2020-03-19 DIAGNOSIS — T63481A Toxic effect of venom of other arthropod, accidental (unintentional), initial encounter: Secondary | ICD-10-CM

## 2020-03-19 DIAGNOSIS — J4530 Mild persistent asthma, uncomplicated: Secondary | ICD-10-CM

## 2020-03-19 DIAGNOSIS — T63481D Toxic effect of venom of other arthropod, accidental (unintentional), subsequent encounter: Secondary | ICD-10-CM | POA: Diagnosis not present

## 2020-03-19 DIAGNOSIS — J3089 Other allergic rhinitis: Secondary | ICD-10-CM

## 2020-03-19 MED ORDER — EPINEPHRINE 0.3 MG/0.3ML IJ SOAJ: INTRAMUSCULAR | 1 refills | Status: DC

## 2020-03-19 NOTE — Progress Notes (Signed)
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 Mild persistent asthma, uncomplicated - Lung testing looked great today. - I agree with using your Arnuity only with flares since you have been doing so well.  - Daily controller medication(s): NONE - Prior to physical activity: ProAir 2 puffs 10-15 minutes before physical activity. - Rescue medications: ProAir 4 puffs every 4-6 hours as needed - Changes during respiratory infections or worsening symptoms: Add on Arnuity 123mcg to 1 puff twice daily for TWO WEEKS. - Asthma control goals:  * Full participation in all desired activities (may need albuterol before activity) * Albuterol use two time or less a week on average (not counting use with activity) * Cough interfering with sleep two time or less a month * Oral steroids no more than once a year * No hospitalizations  2. Allergic rhinitis (grasses, weeds, trees, indoor molds, dust mites, cat, dog, and horse) - Continue with Dymista as needed.    - Continue with Xyzal 5mg  once daily. - We will send in refills.   3. 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  - DO NOT hesitate to use the EpiPen!   4. Return in about 1 year (around 03/19/2021). This can be an in-person, a virtual Webex or a telephone follow up visit.  Subjective:   Cheryl Mata is a 49 y.o. female presenting today for follow up of  Chief Complaint  Patient presents with  . Asthma    Cheryl Mata has a history of the following: Patient Active Problem List   Diagnosis Date Noted  . 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 Other allergic rhinitis 11/20/2015  . Mild intermittent asthma  without complication 03/00/9233  . History of insect sting allergy 11/20/2015    History obtained from: chart review and patient.  Cheryl Mata is a 49 y.o. female presenting for a follow up visit.  [name] was last seen in December 2020.  At that time, her lung testing looked decent.  She was off of the [place], so we decreased her to [place] alone.  For her allergic rhinitis, we continue with Dymista as well as Xyzal.  We recommended continued avoidance of stinging insects.  We also refilled the EpiPen.  Since last visit, she has done very well.  Asthma/Respiratory Symptom History: She is not using Arnuity on a regular basis at all.  She has not used her albuterol in quite some time.  She has not needed any prednisone.. Fall is her worst time of the year. Giulianna's asthma has been well controlled. She has not required rescue medication, experienced nocturnal awakenings due to lower respiratory symptoms, nor have activities of daily living been limited. She has required no Emergency Department or Urgent Care visits for her asthma. She has required zero courses of systemic steroids for asthma exacerbations since the last visit. ACT score today is 24, indicating excellent asthma symptom control. Overall her asthma has been well controlled with the use of the masks since her asthma seems to be mostly triggered by infections.  Allergic Rhinitis Symptom History: She is using her Xyzal 1 tablet once daily.  She is only using the [place] and as needed basis.  She has not needed antibiotics  at all.  Stinging Insect Symptom History: She does report that she was stung by 2 yellow jackets and had large local reactions.  She did not use her EpiPen, as she is afraid of needles.  She instead took 3 Benadryl and slept it off.  She did not have any airway compromise.  Otherwise, there have been no changes to her past medical history, surgical history, family history, or social history.    Review of Systems  Constitutional:  Negative.  Negative for fever, malaise/fatigue and weight loss.  HENT: Negative.  Negative for congestion, ear discharge, ear pain, sinus pain and sore throat.   Eyes: Negative for pain, discharge and redness.  Respiratory: Negative for cough, sputum production, shortness of breath and wheezing.   Cardiovascular: Negative.  Negative for chest pain and palpitations.  Gastrointestinal: Negative for abdominal pain, constipation, diarrhea, heartburn, nausea and vomiting.  Skin: Negative.  Negative for itching and rash.  Neurological: Negative for dizziness and headaches.  Endo/Heme/Allergies: Negative for environmental allergies. Does not bruise/bleed easily.       Objective:   Blood pressure 110/62, pulse 64, temperature 98.1 F (36.7 C), temperature source Temporal, resp. rate 16, SpO2 99 %. There is no height or weight on file to calculate BMI.   Physical Exam:  Physical Exam Constitutional:      Appearance: She is well-developed.  HENT:     Head: Normocephalic and atraumatic.     Right Ear: Tympanic membrane, ear canal and external ear normal.     Left Ear: Tympanic membrane and ear canal normal.     Nose: No nasal deformity, septal deviation, mucosal edema or rhinorrhea.     Right Sinus: No maxillary sinus tenderness or frontal sinus tenderness.     Left Sinus: No maxillary sinus tenderness or frontal sinus tenderness.     Mouth/Throat:     Mouth: Mucous membranes are not pale and not dry.     Pharynx: Uvula midline.  Eyes:     General:        Right eye: No discharge.        Left eye: No discharge.     Conjunctiva/sclera: Conjunctivae normal.     Right eye: Right conjunctiva is not injected. No chemosis.    Left eye: Left conjunctiva is not injected. No chemosis.    Pupils: Pupils are equal, round, and reactive to light.  Cardiovascular:     Rate and Rhythm: Normal rate and regular rhythm.     Heart sounds: Normal heart sounds.  Pulmonary:     Effort: Pulmonary effort  is normal. No tachypnea, accessory muscle usage or respiratory distress.     Breath sounds: Normal breath sounds. No wheezing, rhonchi or rales.  Chest:     Chest wall: No tenderness.  Lymphadenopathy:     Cervical: No cervical adenopathy.  Skin:    Coloration: Skin is not pale.     Findings: No abrasion, erythema, petechiae or rash. Rash is not papular, urticarial or vesicular.  Neurological:     Mental Status: She is alert.      Diagnostic studies:    Spirometry: results normal (FEV1: 2.56/94%, FVC: 3.20/93%, FEV1/FVC: 80%).    Spirometry consistent with normal pattern.   Allergy Studies: none      Salvatore Marvel, MD  [facility] [place]

## 2020-03-19 NOTE — Patient Instructions (Addendum)
1. Mild persistent asthma, uncomplicated - Lung testing looked great today. - I agree with using your Arnuity only with flares since you have been doing so well.  - Daily controller medication(s): NONE - Prior to physical activity: ProAir 2 puffs 10-15 minutes before physical activity. - Rescue medications: ProAir 4 puffs every 4-6 hours as needed - Changes during respiratory infections or worsening symptoms: Add on Arnuity 187mcg to 1 puff twice daily for TWO WEEKS. - Asthma control goals:  * Full participation in all desired activities (may need albuterol before activity) * Albuterol use two time or less a week on average (not counting use with activity) * Cough interfering with sleep two time or less a month * Oral steroids no more than once a year * No hospitalizations  2. Allergic rhinitis (grasses, weeds, trees, indoor molds, dust mites, cat, dog, and horse) - Continue with Dymista as needed.    - Continue with Xyzal 5mg  once daily. - We will send in refills.   3. 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  - DO NOT hesitate to use the EpiPen!   4. Return in about 1 year (around 03/19/2021). This can be an in-person, a virtual Webex or a telephone follow up visit.   Please inform us of any Emergency Department visits, hospitalizations, or changes in symptoms. Call us before going to the ED for breathing or allergy symptoms since we might be able to fit you in for a sick visit. Feel free to contact us anytime with any questions, problems, or concerns.  It was a pleasure to see you again today!  Websites that have reliable patient information: 1. American Academy of Asthma, Allergy, and Immunology: www.aaaai.org 2. Food Allergy Research and Education (FARE): foodallergy.org 3. Mothers of Asthmatics: http://www.asthmacommunitynetwork.org 4. American College of Allergy, Asthma, and Immunology: www.acaai.org   COVID-19 Vaccine Information can be found at:  ShippingScam.co.uk For questions related to vaccine distribution or appointments, please email [email or web address] or call [phone].     "Like" [place] on Facebook and Instagram for our latest updates!        Make sure you are registered to vote! If you have moved or changed any of your contact information, you will need to get this updated before voting!  In some cases, you MAY be able to register to vote online: [email or web address]

## 2020-04-18 ENCOUNTER — Other Ambulatory Visit: Payer: Self-pay | Admitting: Allergy & Immunology

## 2020-05-05 DIAGNOSIS — L237 Allergic contact dermatitis due to plants, except food: Secondary | ICD-10-CM | POA: Diagnosis not present

## 2020-10-12 DIAGNOSIS — Z01419 Encounter for gynecological examination (general) (routine) without abnormal findings: Secondary | ICD-10-CM | POA: Diagnosis not present

## 2020-10-12 DIAGNOSIS — Z6822 Body mass index (BMI) 22.0-22.9, adult: Secondary | ICD-10-CM | POA: Diagnosis not present

## 2020-10-12 DIAGNOSIS — Z1231 Encounter for screening mammogram for malignant neoplasm of breast: Secondary | ICD-10-CM | POA: Diagnosis not present

## 2020-10-14 ENCOUNTER — Other Ambulatory Visit: Payer: Self-pay | Admitting: Obstetrics and Gynecology

## 2020-10-14 DIAGNOSIS — R928 Other abnormal and inconclusive findings on diagnostic imaging of breast: Secondary | ICD-10-CM

## 2020-10-27 ENCOUNTER — Other Ambulatory Visit: Payer: Self-pay

## 2020-10-27 ENCOUNTER — Ambulatory Visit: Payer: BLUE CROSS/BLUE SHIELD

## 2020-10-27 ENCOUNTER — Ambulatory Visit
Admission: RE | Admit: 2020-10-27 | Discharge: 2020-10-27 | Disposition: A | Discharge status: Home / Self Care | Payer: BC Managed Care – PPO | Source: Ambulatory Visit | Attending: Obstetrics and Gynecology | Admitting: Obstetrics and Gynecology

## 2020-10-27 DIAGNOSIS — R922 Inconclusive mammogram: Secondary | ICD-10-CM | POA: Diagnosis not present

## 2020-10-27 DIAGNOSIS — R928 Other abnormal and inconclusive findings on diagnostic imaging of breast: Secondary | ICD-10-CM

## 2020-11-03 DIAGNOSIS — M9902 Segmental and somatic dysfunction of thoracic region: Secondary | ICD-10-CM | POA: Diagnosis not present

## 2020-11-03 DIAGNOSIS — M9901 Segmental and somatic dysfunction of cervical region: Secondary | ICD-10-CM | POA: Diagnosis not present

## 2020-11-03 DIAGNOSIS — M9905 Segmental and somatic dysfunction of pelvic region: Secondary | ICD-10-CM | POA: Diagnosis not present

## 2020-11-03 DIAGNOSIS — M9903 Segmental and somatic dysfunction of lumbar region: Secondary | ICD-10-CM | POA: Diagnosis not present

## 2020-11-10 DIAGNOSIS — M9905 Segmental and somatic dysfunction of pelvic region: Secondary | ICD-10-CM | POA: Diagnosis not present

## 2020-11-10 DIAGNOSIS — M9902 Segmental and somatic dysfunction of thoracic region: Secondary | ICD-10-CM | POA: Diagnosis not present

## 2020-11-10 DIAGNOSIS — M9901 Segmental and somatic dysfunction of cervical region: Secondary | ICD-10-CM | POA: Diagnosis not present

## 2020-11-10 DIAGNOSIS — M9903 Segmental and somatic dysfunction of lumbar region: Secondary | ICD-10-CM | POA: Diagnosis not present

## 2020-11-23 DIAGNOSIS — M9903 Segmental and somatic dysfunction of lumbar region: Secondary | ICD-10-CM | POA: Diagnosis not present

## 2020-11-23 DIAGNOSIS — M9901 Segmental and somatic dysfunction of cervical region: Secondary | ICD-10-CM | POA: Diagnosis not present

## 2020-11-23 DIAGNOSIS — M9905 Segmental and somatic dysfunction of pelvic region: Secondary | ICD-10-CM | POA: Diagnosis not present

## 2020-11-23 DIAGNOSIS — M25451 Effusion, right hip: Secondary | ICD-10-CM | POA: Diagnosis not present

## 2020-11-23 DIAGNOSIS — M25551 Pain in right hip: Secondary | ICD-10-CM | POA: Diagnosis not present

## 2020-11-23 DIAGNOSIS — M9902 Segmental and somatic dysfunction of thoracic region: Secondary | ICD-10-CM | POA: Diagnosis not present

## 2020-11-25 ENCOUNTER — Ambulatory Visit (AMBULATORY_SURGERY_CENTER): Payer: BC Managed Care – PPO | Admitting: *Deleted

## 2020-11-25 ENCOUNTER — Other Ambulatory Visit: Payer: Self-pay

## 2020-11-25 VITALS — Ht 63.0 in | Wt 123.6 lb

## 2020-11-25 DIAGNOSIS — Z800 Family history of malignant neoplasm of digestive organs: Secondary | ICD-10-CM

## 2020-11-25 MED ORDER — SUPREP BOWEL PREP KIT 17.5-3.13-1.6 GM/177ML PO SOLN: 1.0000 | Freq: Once | ORAL | 0 refills | Status: AC

## 2020-11-25 NOTE — Progress Notes (Signed)
No egg or soy allergy known to patient  No issues with past sedation with any surgeries or procedures Patient denies ever being told they had issues or difficulty with intubation  No FH of Malignant Hyperthermia No diet pills per patient No home 02 use per patient  No blood thinners per patient  Pt states issues with constipation - on metamucil gummies- are helping very little - goes from constipation to diarrhea - 2 day suprep  No A fib or A flutter  EMMI video to pt or via [employer] 19 guidelines implemented in [place] today with Pt and RN  Pt is fully vaccinated  for [employer] given to pt in PV today , Code to Pharmacy and  NO PA's for preps discussed with pt In PV today  Discussed with pt there will be an out-of-pocket cost for prep and that varies from $0 to 70 dollars   Due to the COVID-19 pandemic we are asking patients to follow certain guidelines.  Pt aware of COVID protocols and [facility] guidelines

## 2020-11-30 DIAGNOSIS — M9903 Segmental and somatic dysfunction of lumbar region: Secondary | ICD-10-CM | POA: Diagnosis not present

## 2020-11-30 DIAGNOSIS — M9905 Segmental and somatic dysfunction of pelvic region: Secondary | ICD-10-CM | POA: Diagnosis not present

## 2020-11-30 DIAGNOSIS — M9901 Segmental and somatic dysfunction of cervical region: Secondary | ICD-10-CM | POA: Diagnosis not present

## 2020-11-30 DIAGNOSIS — M9902 Segmental and somatic dysfunction of thoracic region: Secondary | ICD-10-CM | POA: Diagnosis not present

## 2020-12-09 ENCOUNTER — Encounter: Payer: Self-pay | Admitting: Gastroenterology

## 2020-12-11 ENCOUNTER — Encounter: Payer: Self-pay | Admitting: Gastroenterology

## 2020-12-16 ENCOUNTER — Ambulatory Visit (AMBULATORY_SURGERY_CENTER): Payer: BC Managed Care – PPO | Admitting: Gastroenterology

## 2020-12-16 ENCOUNTER — Other Ambulatory Visit: Payer: Self-pay | Admitting: Gastroenterology

## 2020-12-16 ENCOUNTER — Other Ambulatory Visit: Payer: Self-pay

## 2020-12-16 ENCOUNTER — Encounter: Payer: Self-pay | Admitting: Gastroenterology

## 2020-12-16 VITALS — BP 136/74 | HR 66 | Temp 97.8°F | Resp 12 | Ht 63.0 in | Wt 123.6 lb

## 2020-12-16 DIAGNOSIS — D120 Benign neoplasm of cecum: Secondary | ICD-10-CM | POA: Diagnosis not present

## 2020-12-16 DIAGNOSIS — K635 Polyp of colon: Secondary | ICD-10-CM

## 2020-12-16 DIAGNOSIS — Z800 Family history of malignant neoplasm of digestive organs: Secondary | ICD-10-CM

## 2020-12-16 DIAGNOSIS — Z1211 Encounter for screening for malignant neoplasm of colon: Secondary | ICD-10-CM

## 2020-12-16 MED ORDER — SODIUM CHLORIDE 0.9 % IV SOLN: 500.0000 mL | Freq: Once | INTRAVENOUS | Status: DC

## 2020-12-16 NOTE — Progress Notes (Signed)
Pt's states no medical or surgical changes since previsit or office visit.  CW - vitals 

## 2020-12-16 NOTE — Progress Notes (Signed)
PT taken to PACU. Monitors in place. VSS. Report given to RN. 

## 2020-12-16 NOTE — Progress Notes (Signed)
Called to room to assist during endoscopic procedure.  Patient ID and intended procedure confirmed with present staff. Received instructions for my participation in the procedure from the performing physician.  

## 2020-12-16 NOTE — Op Note (Signed)
[facility] Patient Name: Cheryl Mata Procedure Date: 12/16/2020 1:53 PM MRN: [id number] Endoscopist: Milus Banister , MD Age: 50 Referring MD:  Date of Birth: [birth year] Gender: Female Account #: [id number] Procedure:                Colonoscopy Indications:              Screening in patient at increased risk: Family                            history of 1st-degree relative with colorectal                            cancer (brother had colon cancer in his 37s) Medicines:                Monitored Anesthesia Care Procedure:                Pre-Anesthesia Assessment:                           - Prior to the procedure, a History and Physical                            was performed, and patient medications and                            allergies were reviewed. The patient's tolerance of                            previous anesthesia was also reviewed. The risks                            and benefits of the procedure and the sedation                            options and risks were discussed with the patient.                            All questions were answered, and informed consent                            was obtained. Prior Anticoagulants: The patient has                            taken no previous anticoagulant or antiplatelet                            agents. ASA Grade Assessment: II - A patient with                            mild systemic disease. After reviewing the risks                            and benefits, the patient was deemed in  satisfactory condition to undergo the procedure.                           After obtaining informed consent, the colonoscope                            was passed under direct vision. Throughout the                            procedure, the patient's blood pressure, pulse, and                            oxygen saturations were monitored continuously. The                            Olympus CF-HQ190L  (Serial# [id number]) Colonoscope was                            introduced through the anus and advanced to the the                            cecum, identified by appendiceal orifice and                            ileocecal valve. The colonoscopy was performed                            without difficulty. The patient tolerated the                            procedure well. The quality of the bowel                            preparation was good. The ileocecal valve,                            appendiceal orifice, and rectum were photographed. Scope In: 1:56:57 PM Scope Out: 2:13:56 PM Scope Withdrawal Time: 0 hours 11 minutes 36 seconds  Total Procedure Duration: 0 hours 16 minutes 59 seconds  Findings:                 A 23 mm polyp was found in the cecum. The polyp was                            flat. The polyp was removed with a piecemeal                            technique using a cold snare. Resection and                            retrieval were complete.                           The exam was otherwise without abnormality on  direct and retroflexion views. Complications:            No immediate complications. Estimated blood loss:                            None. Estimated Blood Loss:     Estimated blood loss: none. Impression:               - One 23 mm polyp in the cecum, removed piecemeal                            using a cold snare. Resected and retrieved.                           - The examination was otherwise normal on direct                            and retroflexion views. Recommendation:           - Patient has a contact number available for                            emergencies. The signs and symptoms of potential                            delayed complications were discussed with the                            patient. Return to normal activities tomorrow.                            Written discharge instructions were provided to the                             patient.                           - Resume previous diet.                           - Continue present medications.                           - Await pathology results. Milus Banister, MD 12/16/2020 2:16:31 PM This report has been signed electronically.

## 2020-12-16 NOTE — Patient Instructions (Signed)
Discharge instructions given. Handout on polyps. Resume previous medications. YOU HAD AN ENDOSCOPIC PROCEDURE TODAY AT THE [facility]:   Refer to the procedure report that was given to you for any specific questions about what was found during the examination.  If the procedure report does not answer your questions, please call your gastroenterologist to clarify.  If you requested that your care partner not be given the details of your procedure findings, then the procedure report has been included in a sealed envelope for you to review at your convenience later.  YOU SHOULD EXPECT: Some feelings of bloating in the abdomen. Passage of more gas than usual.  Walking can help get rid of the air that was put into your GI tract during the procedure and reduce the bloating. If you had a lower endoscopy (such as a colonoscopy or flexible sigmoidoscopy) you may notice spotting of blood in your stool or on the toilet paper. If you underwent a bowel prep for your procedure, you may not have a normal bowel movement for a few days.  Please Note:  You might notice some irritation and congestion in your nose or some drainage.  This is from the oxygen used during your procedure.  There is no need for concern and it should clear up in a day or so.  SYMPTOMS TO REPORT IMMEDIATELY:  Following lower endoscopy (colonoscopy or flexible sigmoidoscopy):  Excessive amounts of blood in the stool  Significant tenderness or worsening of abdominal pains  Swelling of the abdomen that is new, acute  Fever of 100F or higher   For urgent or emergent issues, a gastroenterologist can be reached at any hour by calling [phone]. Do not use MyChart messaging for urgent concerns.    DIET:  We do recommend a small meal at first, but then you may proceed to your regular diet.  Drink plenty of fluids but you should avoid alcoholic beverages for 24 hours.  ACTIVITY:  You should plan to take it easy for the rest  of today and you should NOT DRIVE or use heavy machinery until tomorrow (because of the sedation medicines used during the test).    FOLLOW UP: Our staff will call the number listed on your records 48-72 hours following your procedure to check on you and address any questions or concerns that you may have regarding the information given to you following your procedure. If we do not reach you, we will leave a message.  We will attempt to reach you two times.  During this call, we will ask if you have developed any symptoms of COVID 19. If you develop any symptoms (ie: fever, flu-like symptoms, shortness of breath, cough etc.) before then, please call [phone].  If you test positive for Covid 19 in the 2 weeks post procedure, please call and report this information to us.    If any biopsies were taken you will be contacted by phone or by letter within the next 1-3 weeks.  Please call us at [phone] if you have not heard about the biopsies in 3 weeks.    SIGNATURES/CONFIDENTIALITY: You and/or your care partner have signed paperwork which will be entered into your electronic medical record.  These signatures attest to the fact that that the information above on your After Visit Summary has been reviewed and is understood.  Full responsibility of the confidentiality of this discharge information lies with you and/or your care-partner.  

## 2020-12-21 ENCOUNTER — Telehealth: Payer: Self-pay

## 2020-12-21 NOTE — Telephone Encounter (Signed)
LVM

## 2021-01-04 ENCOUNTER — Ambulatory Visit
Admission: RE | Admit: 2021-01-04 | Discharge: 2021-01-04 | Disposition: A | Discharge status: Home / Self Care | Payer: BC Managed Care – PPO | Source: Ambulatory Visit | Attending: Sports Medicine | Admitting: Sports Medicine

## 2021-01-04 ENCOUNTER — Other Ambulatory Visit: Payer: Self-pay

## 2021-01-04 ENCOUNTER — Other Ambulatory Visit: Payer: Self-pay | Admitting: Sports Medicine

## 2021-01-04 DIAGNOSIS — R52 Pain, unspecified: Secondary | ICD-10-CM

## 2021-01-04 DIAGNOSIS — M25451 Effusion, right hip: Secondary | ICD-10-CM | POA: Diagnosis not present

## 2021-01-04 DIAGNOSIS — M25551 Pain in right hip: Secondary | ICD-10-CM | POA: Diagnosis not present

## 2021-01-11 DIAGNOSIS — M9905 Segmental and somatic dysfunction of pelvic region: Secondary | ICD-10-CM | POA: Diagnosis not present

## 2021-01-11 DIAGNOSIS — M9901 Segmental and somatic dysfunction of cervical region: Secondary | ICD-10-CM | POA: Diagnosis not present

## 2021-01-11 DIAGNOSIS — M9903 Segmental and somatic dysfunction of lumbar region: Secondary | ICD-10-CM | POA: Diagnosis not present

## 2021-01-11 DIAGNOSIS — M9902 Segmental and somatic dysfunction of thoracic region: Secondary | ICD-10-CM | POA: Diagnosis not present

## 2021-02-05 DIAGNOSIS — M791 Myalgia, unspecified site: Secondary | ICD-10-CM | POA: Diagnosis not present

## 2021-02-05 DIAGNOSIS — M9902 Segmental and somatic dysfunction of thoracic region: Secondary | ICD-10-CM | POA: Diagnosis not present

## 2021-02-05 DIAGNOSIS — M9903 Segmental and somatic dysfunction of lumbar region: Secondary | ICD-10-CM | POA: Diagnosis not present

## 2021-02-05 DIAGNOSIS — M9905 Segmental and somatic dysfunction of pelvic region: Secondary | ICD-10-CM | POA: Diagnosis not present

## 2021-02-05 DIAGNOSIS — M9901 Segmental and somatic dysfunction of cervical region: Secondary | ICD-10-CM | POA: Diagnosis not present

## 2021-02-22 DIAGNOSIS — M9905 Segmental and somatic dysfunction of pelvic region: Secondary | ICD-10-CM | POA: Diagnosis not present

## 2021-02-22 DIAGNOSIS — M62838 Other muscle spasm: Secondary | ICD-10-CM | POA: Diagnosis not present

## 2021-02-22 DIAGNOSIS — M9903 Segmental and somatic dysfunction of lumbar region: Secondary | ICD-10-CM | POA: Diagnosis not present

## 2021-02-22 DIAGNOSIS — M9901 Segmental and somatic dysfunction of cervical region: Secondary | ICD-10-CM | POA: Diagnosis not present

## 2021-02-22 DIAGNOSIS — M9902 Segmental and somatic dysfunction of thoracic region: Secondary | ICD-10-CM | POA: Diagnosis not present

## 2021-03-09 DIAGNOSIS — R198 Other specified symptoms and signs involving the digestive system and abdomen: Secondary | ICD-10-CM | POA: Diagnosis not present

## 2021-03-18 DIAGNOSIS — M62838 Other muscle spasm: Secondary | ICD-10-CM | POA: Diagnosis not present

## 2021-03-18 DIAGNOSIS — M9901 Segmental and somatic dysfunction of cervical region: Secondary | ICD-10-CM | POA: Diagnosis not present

## 2021-03-18 DIAGNOSIS — M9903 Segmental and somatic dysfunction of lumbar region: Secondary | ICD-10-CM | POA: Diagnosis not present

## 2021-03-18 DIAGNOSIS — M9905 Segmental and somatic dysfunction of pelvic region: Secondary | ICD-10-CM | POA: Diagnosis not present

## 2021-03-18 DIAGNOSIS — M9902 Segmental and somatic dysfunction of thoracic region: Secondary | ICD-10-CM | POA: Diagnosis not present

## 2021-06-14 DIAGNOSIS — D1801 Hemangioma of skin and subcutaneous tissue: Secondary | ICD-10-CM | POA: Diagnosis not present

## 2021-06-14 DIAGNOSIS — D2272 Melanocytic nevi of left lower limb, including hip: Secondary | ICD-10-CM | POA: Diagnosis not present

## 2021-06-14 DIAGNOSIS — L814 Other melanin hyperpigmentation: Secondary | ICD-10-CM | POA: Diagnosis not present

## 2021-06-14 DIAGNOSIS — X32XXXS Exposure to sunlight, sequela: Secondary | ICD-10-CM | POA: Diagnosis not present

## 2021-07-06 ENCOUNTER — Encounter: Payer: Self-pay | Admitting: Gastroenterology

## 2021-08-10 ENCOUNTER — Encounter: Payer: Self-pay | Admitting: Gastroenterology

## 2021-09-24 ENCOUNTER — Other Ambulatory Visit: Payer: Self-pay

## 2021-09-24 ENCOUNTER — Ambulatory Visit (AMBULATORY_SURGERY_CENTER): Payer: Self-pay

## 2021-09-24 VITALS — Ht 63.0 in | Wt 125.0 lb

## 2021-09-24 DIAGNOSIS — Z86010 Personal history of colonic polyps: Secondary | ICD-10-CM

## 2021-09-24 MED ORDER — NA SULFATE-K SULFATE-MG SULF 17.5-3.13-1.6 GM/177ML PO SOLN: 1.0000 | Freq: Once | ORAL | 0 refills | Status: AC

## 2021-09-24 NOTE — Progress Notes (Signed)
Denies allergies to eggs or soy products. Denies complication of anesthesia or sedation. Denies use of weight loss medication. Denies use of O2.   Emmi instructions given for colonoscopy.  

## 2021-10-05 ENCOUNTER — Encounter: Payer: Self-pay | Admitting: Gastroenterology

## 2021-10-07 ENCOUNTER — Encounter: Payer: Self-pay | Admitting: Certified Registered Nurse Anesthetist

## 2021-10-08 ENCOUNTER — Encounter: Payer: Self-pay | Admitting: Gastroenterology

## 2021-10-08 ENCOUNTER — Ambulatory Visit (AMBULATORY_SURGERY_CENTER): Payer: BC Managed Care – PPO | Admitting: Gastroenterology

## 2021-10-08 ENCOUNTER — Other Ambulatory Visit: Payer: Self-pay

## 2021-10-08 VITALS — BP 141/93 | HR 71 | Temp 96.4°F | Resp 14 | Ht 63.0 in | Wt 125.0 lb

## 2021-10-08 DIAGNOSIS — Z1211 Encounter for screening for malignant neoplasm of colon: Secondary | ICD-10-CM | POA: Diagnosis not present

## 2021-10-08 DIAGNOSIS — K388 Other specified diseases of appendix: Secondary | ICD-10-CM | POA: Diagnosis not present

## 2021-10-08 DIAGNOSIS — K635 Polyp of colon: Secondary | ICD-10-CM

## 2021-10-08 DIAGNOSIS — Z86010 Personal history of colonic polyps: Secondary | ICD-10-CM

## 2021-10-08 DIAGNOSIS — D120 Benign neoplasm of cecum: Secondary | ICD-10-CM

## 2021-10-08 HISTORY — PX: COLONOSCOPY: SHX174

## 2021-10-08 MED ORDER — SODIUM CHLORIDE 0.9 % IV SOLN: 500.0000 mL | Freq: Once | INTRAVENOUS | Status: DC

## 2021-10-08 NOTE — Progress Notes (Signed)
Called to room to assist during endoscopic procedure.  Patient ID and intended procedure confirmed with present staff. Received instructions for my participation in the procedure from the performing physician.  

## 2021-10-08 NOTE — Patient Instructions (Signed)
HANDOUTS PROVIDED ON: POLYPS   The polyp removed/biopsies taken today have been sent for pathology.  The results can take 1-3 weeks to receive.  When your next colonoscopy should occur will be based on the pathology results.    You may resume your previous diet and medication schedule.  Thank you for allowing [place] to care for you today!!!   YOU HAD AN ENDOSCOPIC PROCEDURE TODAY AT [facility]:   Refer to the procedure report that was given to you for any specific questions about what was found during the examination.  If the procedure report does not answer your questions, please call your gastroenterologist to clarify.  If you requested that your care partner not be given the details of your procedure findings, then the procedure report has been included in a sealed envelope for you to review at your convenience later.  YOU SHOULD EXPECT: Some feelings of bloating in the abdomen. Passage of more gas than usual.  Walking can help get rid of the air that was put into your GI tract during the procedure and reduce the bloating. If you had a lower endoscopy (such as a colonoscopy or flexible sigmoidoscopy) you may notice spotting of blood in your stool or on the toilet paper. If you underwent a bowel prep for your procedure, you may not have a normal bowel movement for a few days.  Please Note:  You might notice some irritation and congestion in your nose or some drainage.  This is from the oxygen used during your procedure.  There is no need for concern and it should clear up in a day or so.  SYMPTOMS TO REPORT IMMEDIATELY:  Following lower endoscopy (colonoscopy or flexible sigmoidoscopy):  Excessive amounts of blood in the stool  Significant tenderness or worsening of abdominal pains  Swelling of the abdomen that is new, acute  Fever of 100F or higher  For urgent or emergent issues, a gastroenterologist can be reached at any hour by calling [phone]. Do not use MyChart  messaging for urgent concerns.    DIET:  We do recommend a small meal at first, but then you may proceed to your regular diet.  Drink plenty of fluids but you should avoid alcoholic beverages for 24 hours.  ACTIVITY:  You should plan to take it easy for the rest of today and you should NOT DRIVE or use heavy machinery until tomorrow (because of the sedation medicines used during the test).    FOLLOW UP: Our staff will call the number listed on your records Tuesday morning between 7:15 am and 8:15 am following your procedure to check on you and address any questions or concerns that you may have regarding the information given to you following your procedure. If we do not reach you, we will leave a message.  We will attempt to reach you two times.  During this call, we will ask if you have developed any symptoms of COVID 19. If you develop any symptoms (ie: fever, flu-like symptoms, shortness of breath, cough etc.) before then, please call [phone].  If you test positive for Covid 19 in the 2 weeks post procedure, please call and report this information to [place].    If any biopsies were taken you will be contacted by phone or by letter within the next 1-3 weeks.  Please call us at [phone] if you have not heard about the biopsies in 3 weeks.    SIGNATURES/CONFIDENTIALITY: You and/or your care partner have  signed paperwork which will be entered into your electronic medical record.  These signatures attest to the fact that that the information above on your After Visit Summary has been reviewed and is understood.  Full responsibility of the confidentiality of this discharge information lies with you and/or your care-partner.

## 2021-10-08 NOTE — Progress Notes (Signed)
HPI: This is a woman at increased risk for [facility]  Brother had colon cancer in his 62s. Colonsocopy 12/2020 35mm cecal polyp, piecemeal snare resected, SSP on pathology   ROS: complete GI ROS as described in HPI, all other review negative.  Constitutional:  No unintentional weight loss   Past Medical History:  Diagnosis Date   Allergy    Anemia    past hx    Asthma    IBS (irritable bowel syndrome)     Past Surgical History:  Procedure Laterality Date   ADENOIDECTOMY     BREAST BIOPSY     BREAST ENHANCEMENT SURGERY     COLONOSCOPY     > 10 yrs ago    FOOT SURGERY     rods both  feet  with bunionectomy    PARTIAL HYSTERECTOMY     TONSILLECTOMY     TYMPANOSTOMY TUBE PLACEMENT      Current Outpatient Medications  Medication Sig Dispense Refill   acyclovir (ZOVIRAX) 200 MG capsule acyclovir 200 mg capsule  TAKE 1 CAPSULE BY MOUTH UP TO FIVE TIMES DAILY FOR OUTBREAK     albuterol (PROAIR HFA) 108 (90 Base) MCG/ACT inhaler Inhale 2 puffs into the lungs every 4 (four) hours as needed for wheezing or shortness of breath. 1 Inhaler 3   albuterol (VENTOLIN HFA) 108 (90 Base) MCG/ACT inhaler Inhale 2 puffs into the lungs every 4 (four) hours as needed. 18 g 1   Azelastine-Fluticasone (DYMISTA) 137-50 MCG/ACT SUSP 1-2 sprays each nostril BID, prn (Patient not taking: Reported on 11/25/2020) 23 g 5   EPINEPHrine (EPIPEN 2-PAK) 0.3 mg/0.3 mL IJ SOAJ injection Use as directed for severe allergic reaction (Patient not taking: Reported on 10/08/2021) 2 each 1   Fluticasone Furoate (ARNUITY ELLIPTA) 100 MCG/ACT AEPB Inhale 1 Dose into the lungs daily. 30 each 5   levocetirizine (XYZAL) 5 MG tablet Take 1 tablet (5 mg total) by mouth every evening. 34 tablet 5   levocetirizine (XYZAL) 5 MG tablet TAKE 1 TABLET(5 MG) BY MOUTH EVERY EVENING 30 tablet 5   mometasone (NASONEX) 50 MCG/ACT nasal spray Place 2 sprays into the nose daily. Reported on 11/20/2015 17 g 5   Rhubarb (ESTROVEN COMPLETE PO)  Estroven     Current Facility-Administered Medications  Medication Dose Route Frequency Provider Last Rate Last Admin   0.9 %  sodium chloride infusion  500 mL Intravenous Once Milus Banister, MD        Allergies as of 10/08/2021 - Review Complete 10/08/2021  Allergen Reaction Noted   Yellow jacket venom [bee venom]  08/23/2018    Family History  Problem Relation Age of Onset   Breast cancer Mother 49   Hypertension Mother    Hypertension Father    Colon cancer Brother 1       dx'd in his 55's   Crohn's disease Brother    Colon polyps Brother    Colon cancer Other    Crohn's disease Daughter    Crohn's disease Cousin    Esophageal cancer Neg Hx    Rectal cancer Neg Hx    Stomach cancer Neg Hx     Social History   Socioeconomic History   Marital status: Married    Spouse name: Not on file   Number of children: Not on file   Years of education: Not on file   Highest education level: Not on file  Occupational History   Not on file  Tobacco Use   Smoking  status: Never   Smokeless tobacco: Never  Vaping Use   Vaping Use: Never used  Substance and Sexual Activity   Alcohol use: Yes    Comment: social - occ    Drug use: No   Sexual activity: Yes  Other Topics Concern   Not on file  Social History Narrative   Not on file   Social Determinants of Health   Financial Resource Strain: Not on file  Food Insecurity: Not on file  Transportation Needs: Not on file  Physical Activity: Not on file  Stress: Not on file  Social Connections: Not on file  Intimate Partner Violence: Not on file     Physical Exam: BP 122/76    Pulse 72    Temp (!) 96.4 F (35.8 C)    Ht 5\' 3"  (1.6 m)    Wt 125 lb (56.7 kg)    SpO2 96%    BMI 22.14 kg/m  Constitutional: generally well-appearing Psychiatric: alert and oriented x3 Lungs: CTA bilaterally Heart: no MCR  Assessment and plan: 51 y.o. female with elevated risk for CRC  Surveillance colonoscopy today  Care is  appropriate for the ambulatory setting.  Owens Loffler, MD [facility] 10/08/2021, 8:47 AM

## 2021-10-08 NOTE — Progress Notes (Signed)
Pt's states no medical or surgical changes since previsit or office visit. 

## 2021-10-08 NOTE — Progress Notes (Signed)
Report given to PACU, vss 

## 2021-10-08 NOTE — Op Note (Signed)
[facility] Patient Name: Cheryl Mata Procedure Date: 10/08/2021 8:39 AM MRN: [id number] Endoscopist: Milus Banister , MD Age: 51 Referring MD:  Date of Birth: 04-29-1971 Gender: Female Account #: [id number] Procedure:                Colonoscopy Indications:              High risk colon cancer surveillance: Personal                            history of colonic polyps Medicines:                Monitored Anesthesia Care Procedure:                Pre-Anesthesia Assessment:                           - Prior to the procedure, a History and Physical                            was performed, and patient medications and                            allergies were reviewed. The patient's tolerance of                            previous anesthesia was also reviewed. The risks                            and benefits of the procedure and the sedation                            options and risks were discussed with the patient.                            All questions were answered, and informed consent                            was obtained. Prior Anticoagulants: The patient has                            taken no previous anticoagulant or antiplatelet                            agents. ASA Grade Assessment: II - A patient with                            mild systemic disease. After reviewing the risks                            and benefits, the patient was deemed in                            satisfactory condition to undergo the procedure.  After obtaining informed consent, the colonoscope                            was passed under direct vision. Throughout the                            procedure, the patient's blood pressure, pulse, and                            oxygen saturations were monitored continuously. The                            Olympus CF-HQ190L [id number]) Colonoscope was                            introduced through the anus and advanced to  the the                            cecum, identified by appendiceal orifice and                            ileocecal valve. The colonoscopy was performed                            without difficulty. The patient tolerated the                            procedure well. The quality of the bowel                            preparation was good. The ileocecal valve,                            appendiceal orifice, and rectum were photographed. Scope In: 8:58:31 AM Scope Out: 9:14:09 AM Scope Withdrawal Time: 0 hours 12 minutes 10 seconds  Total Procedure Duration: 0 hours 15 minutes 38 seconds  Findings:                 There was 4-43mm of residual, recurrent polypoid                            mucosa in the cecum. I used a cold snare to remove                            and sent it to pathology.                           There was new mucous capped polypoid type 'lesion'                            measuring 1-2cm and clearly growing into or from                            the appencideal orifice. See images. This is  not                            endoscopicallly removal and was biopsied                            extensively.                           The exam was otherwise without abnormality on                            direct and retroflexion views. Complications:            No immediate complications. Estimated blood loss:                            None. Estimated Blood Loss:     Estimated blood loss: none. Impression:               - There was 4-63mm of residual, recurrent polypoid                            mucosa in the cecum. Snare resected and sent to                            pathology.                           - There was new mucous capped polypoid type                            'lesion' measuring 1-2cm and clearly growing into                            or from the appencideal orifice. See images. This                            is not endoscopicallly removal and was biopsied                             extensively.                           - The examination was otherwise normal on direct                            and retroflexion views. Recommendation:           - Patient has a contact number available for                            emergencies. The signs and symptoms of potential                            delayed complications were discussed with the  patient. Return to normal activities tomorrow.                            Written discharge instructions were provided to the                            patient.                           - Resume previous diet.                           - Continue present medications.                           - Await pathology results. Likely referral to                            [facility] for segmental colectomy (cecectomy). Milus Banister, MD 10/08/2021 9:39:51 AM This report has been signed electronically.

## 2021-10-12 ENCOUNTER — Telehealth: Payer: Self-pay | Admitting: *Deleted

## 2021-10-12 NOTE — Telephone Encounter (Signed)
°  Follow up Call-  Call back number 10/08/2021 12/16/2020  Post procedure Call Back phone  # [phone]  Permission to leave phone message Yes Yes  Some recent data might be hidden     Patient questions:  Do you have a fever, pain , or abdominal swelling? No. Pain Score  0 *  Have you tolerated food without any problems? Yes.    Have you been able to return to your normal activities? Yes.    Do you have any questions about your discharge instructions: Diet   No. Medications  No. Follow up visit  No.  Do you have questions or concerns about your Care? No.  Actions: * If pain score is 4 or above: No action needed, pain <4.

## 2021-10-20 DIAGNOSIS — Z6822 Body mass index (BMI) 22.0-22.9, adult: Secondary | ICD-10-CM | POA: Diagnosis not present

## 2021-10-20 DIAGNOSIS — Z01419 Encounter for gynecological examination (general) (routine) without abnormal findings: Secondary | ICD-10-CM | POA: Diagnosis not present

## 2021-10-20 DIAGNOSIS — Z1231 Encounter for screening mammogram for malignant neoplasm of breast: Secondary | ICD-10-CM | POA: Diagnosis not present

## 2021-10-20 DIAGNOSIS — Z1272 Encounter for screening for malignant neoplasm of vagina: Secondary | ICD-10-CM | POA: Diagnosis not present

## 2021-10-28 DIAGNOSIS — D126 Benign neoplasm of colon, unspecified: Secondary | ICD-10-CM | POA: Diagnosis not present

## 2021-12-31 NOTE — Progress Notes (Signed)
Sent message, via epic in basket, requesting orders in epic from surgeon.  

## 2022-01-01 ENCOUNTER — Ambulatory Visit: Payer: Self-pay | Admitting: Surgery

## 2022-01-06 NOTE — Patient Instructions (Addendum)
DUE TO COVID-19 ONLY TWO VISITORS  (aged 51 and older)  ARE ALLOWED TO COME WITH YOU AND STAY IN THE WAITING ROOM ONLY DURING PRE OP AND PROCEDURE.   ?**NO VISITORS ARE ALLOWED IN THE SHORT STAY AREA OR RECOVERY ROOM!!** ? ?IF YOU WILL BE ADMITTED INTO THE HOSPITAL YOU ARE ALLOWED ONLY FOUR SUPPORT PEOPLE DURING VISITATION HOURS ONLY (7 AM -8PM)   ?The support person(s) must pass our screening, gel in and out, and wear a mask at all times, including in the patient?s room. ?Patients must also wear a mask when staff or their support person are in the room. ?Visitors GUEST BADGE MUST BE WORN VISIBLY  ?One adult visitor may remain with you overnight and MUST be in the room by 8 P.M. ?  ? ? Your procedure is scheduled on: 01/19/22 ? ? Report to [facility] Main Entrance ? ?  Report to admitting at  9:45 AM ? ? Call this number if you have problems the morning of surgery [phone] ? ? Do not eat food :After Midnight. ? ? After Midnight you may have the following liquids until __9:00 _ AM/  DAY OF SURGERY ? ?Water ?Black Coffee (sugar ok, NO MILK/CREAM OR CREAMERS)  ?Tea (sugar ok, NO MILK/CREAM OR CREAMERS) regular and decaf                             ?Plain Jell-O (NO RED)                                           ?Fruit ices (not with fruit pulp, NO RED)                                     ?Popsicles (NO RED)                                                                  ?Juice: apple, WHITE grape, WHITE cranberry ?Sports drinks like Gatorade (NO RED) ?Clear broth(vegetable,chicken,beef) ? ?       If you have questions, please contact your surgeon?s office. ? ? ?FOLLOW BOWEL PREP AND ANY ADDITIONAL PRE OP INSTRUCTIONS YOU RECEIVED FROM YOUR SURGEON'S OFFICE!!! ?  ?  ?Oral Hygiene is also important to reduce your risk of infection.                                    ?Remember - BRUSH YOUR TEETH THE MORNING OF SURGERY WITH YOUR REGULAR TOOTHPASTE ? ? Do NOT smoke after Midnight ? ? Take these medicines the  morning of surgery with A SIP OF WATER: Use your inhaler and bring it with you ? ? ?                  ?           You may not have any metal on your body including hair pins, jewelry, and body piercing ? ?  Do not wear make-up, lotions, powders, perfumes/cologne, or deodorant ? ?Do not wear nail polish including gel and S&S, artificial/acrylic nails, or any other type of covering on natural nails including finger and toenails. If you have artificial nails, gel coating, etc. that needs to be removed by a nail salon please have this removed prior to surgery or surgery may need to be canceled/ delayed if the surgeon/ anesthesia feels like they are unable to be safely monitored.  ? ?Do not shave  48 hours prior to surgery.  ? ?            Men may shave face and neck. ? ? Do not bring valuables to the hospital. [facility] NOT ?            RESPONSIBLE   FOR VALUABLES. ? ? Contacts, dentures or bridgework may not be worn into surgery. ? ? Bring small overnight bag day of surgery. ?  ? Patients discharged on the day of surgery will not be allowed to drive home.  Someone NEEDS to stay with you for the first 24 hours after anesthesia. ? ? Special Instructions: Bring a copy of your healthcare power of attorney and living will documents the day of surgery if you haven't scanned them before. ? ?            Please read over the following fact sheets you were given: IF [place] [phone] ? ?   [place] - Preparing for Surgery ?Before surgery, you can play an important role.  Because skin is not sterile, your skin needs to be as free of germs as possible.  You can reduce the number of germs on your skin by washing with CHG (chlorahexidine gluconate) soap before surgery.  CHG is an antiseptic cleaner which kills germs and bonds with the skin to continue killing germs even after washing. ?Please DO NOT use if you have an allergy to CHG or antibacterial soaps.  If  your skin becomes reddened/irritated stop using the CHG and inform your nurse when you arrive at Short Stay..  You may shave your face/neck. ?Please follow these instructions carefully: ? 1.  Shower with CHG Soap the night before surgery and the  morning of Surgery. ? 2.  If you choose to wash your hair, wash your hair first as usual with your  normal  shampoo. ? 3.  After you shampoo, rinse your hair and body thoroughly to remove the  shampoo.                    ?        4.  Use CHG as you would any other liquid soap.  You can apply chg directly  to the skin and wash  ?                     Gently with a scrungie or clean washcloth. ? 5.  Apply the CHG Soap to your body ONLY FROM THE NECK DOWN.   Do not use on face/ open      ?                     Wound or open sores. Avoid contact with eyes, ears mouth and genitals (private parts).  ?                     Production manager,  Development worker, international aid (private  parts) with your normal soap. ?            6.  Wash thoroughly, paying special attention to the area where your surgery  will be performed. ? 7.  Thoroughly rinse your body with warm water from the neck down. ? 8.  DO NOT shower/wash with your normal soap after using and rinsing off  the CHG Soap. ?               9.  Pat yourself dry with a clean towel. ?           10.  Wear clean pajamas. ?           11.  Place clean sheets on your bed the night of your first shower and do not  sleep with pets. ?Day of Surgery : ?Do not apply any lotions/deodorants the morning of surgery.  Please wear clean clothes to the hospital/surgery center. ? ?FAILURE TO FOLLOW THESE INSTRUCTIONS MAY RESULT IN THE CANCELLATION OF YOUR SURGERY ? ? ?________________________________________________________________________  ?

## 2022-01-07 ENCOUNTER — Other Ambulatory Visit: Payer: Self-pay

## 2022-01-07 ENCOUNTER — Encounter (HOSPITAL_COMMUNITY)
Admission: RE | Admit: 2022-01-07 | Discharge: 2022-01-07 | Disposition: A | Discharge status: Home / Self Care | Payer: BC Managed Care – PPO | Source: Ambulatory Visit | Attending: Surgery | Admitting: Surgery

## 2022-01-07 ENCOUNTER — Encounter (HOSPITAL_COMMUNITY): Payer: Self-pay

## 2022-01-07 DIAGNOSIS — Z01818 Encounter for other preprocedural examination: Secondary | ICD-10-CM

## 2022-01-07 DIAGNOSIS — Z01812 Encounter for preprocedural laboratory examination: Secondary | ICD-10-CM | POA: Diagnosis not present

## 2022-01-07 LAB — CBC
HCT: 44.4 % (ref 36.0–46.0)
Hemoglobin: 14.4 g/dL (ref 12.0–15.0)
MCH: 28.5 pg (ref 26.0–34.0)
MCHC: 32.4 g/dL (ref 30.0–36.0)
MCV: 87.7 fL (ref 80.0–100.0)
Platelets: 191 10*3/uL (ref 150–400)
RBC: 5.06 MIL/uL (ref 3.87–5.11)
RDW: 12.5 % (ref 11.5–15.5)
WBC: 4.7 10*3/uL (ref 4.0–10.5)
nRBC: 0 % (ref 0.0–0.2)

## 2022-01-07 NOTE — Progress Notes (Signed)
Anesthesia note: ? ?Bowel prep reminder:no ? ?PCP - Dr. Matilde Haymaker ?Cardiologist -none ?Other-  ? ?Chest x-ray - no ?EKG - no ?Stress Test - no ?ECHO - no ?Cardiac Cath - no ? ?Pacemaker/ICD device last checked:NA ? ?Sleep Study - no ?CPAP -  ? ?Pt is pre diabetic-NA ?Fasting Blood Sugar -  ?Checks Blood Sugar _____ ? ?Blood Thinner:NA ?Blood Thinner Instructions: ?Aspirin Instructions: ?Last Dose: ? ?Anesthesia review: no ? ?Patient denies shortness of breath, fever, cough and chest pain at PAT appointment ?No SOB with any activities.  ? ?Patient verbalized understanding of instructions that were given to them at the PAT appointment. Patient was also instructed that they will need to review over the PAT instructions again at home before surgery. yes ?

## 2022-01-19 ENCOUNTER — Ambulatory Visit (HOSPITAL_COMMUNITY): Payer: BC Managed Care – PPO | Admitting: Certified Registered Nurse Anesthetist

## 2022-01-19 ENCOUNTER — Ambulatory Visit (HOSPITAL_COMMUNITY)
Admission: RE | Admit: 2022-01-19 | Discharge: 2022-01-19 | Disposition: A | Discharge status: Home / Self Care | Payer: BC Managed Care – PPO | Source: Ambulatory Visit | Attending: Surgery | Admitting: Surgery

## 2022-01-19 ENCOUNTER — Encounter (HOSPITAL_COMMUNITY)
Admission: RE | Disposition: A | Discharge status: Home / Self Care | Payer: Self-pay | Source: Ambulatory Visit | Attending: Surgery

## 2022-01-19 ENCOUNTER — Encounter (HOSPITAL_COMMUNITY): Payer: Self-pay | Admitting: Surgery

## 2022-01-19 ENCOUNTER — Other Ambulatory Visit: Payer: Self-pay

## 2022-01-19 DIAGNOSIS — J449 Chronic obstructive pulmonary disease, unspecified: Secondary | ICD-10-CM | POA: Diagnosis not present

## 2022-01-19 DIAGNOSIS — Z9689 Presence of other specified functional implants: Secondary | ICD-10-CM | POA: Diagnosis not present

## 2022-01-19 DIAGNOSIS — Z01818 Encounter for other preprocedural examination: Secondary | ICD-10-CM

## 2022-01-19 DIAGNOSIS — R109 Unspecified abdominal pain: Secondary | ICD-10-CM | POA: Diagnosis not present

## 2022-01-19 DIAGNOSIS — D121 Benign neoplasm of appendix: Secondary | ICD-10-CM | POA: Diagnosis not present

## 2022-01-19 DIAGNOSIS — D126 Benign neoplasm of colon, unspecified: Secondary | ICD-10-CM | POA: Insufficient documentation

## 2022-01-19 DIAGNOSIS — D120 Benign neoplasm of cecum: Secondary | ICD-10-CM | POA: Diagnosis not present

## 2022-01-19 DIAGNOSIS — K635 Polyp of colon: Secondary | ICD-10-CM | POA: Diagnosis not present

## 2022-01-19 HISTORY — PX: LAPAROSCOPIC APPENDECTOMY: SHX408

## 2022-01-19 SURGERY — APPENDECTOMY, LAPAROSCOPIC
Anesthesia: General

## 2022-01-19 MED ORDER — HYDROMORPHONE HCL 1 MG/ML IJ SOLN: 0.2500 mg | INTRAMUSCULAR | Status: DC | PRN

## 2022-01-19 MED ORDER — ORAL CARE MOUTH RINSE: 15.0000 mL | Freq: Once | OROMUCOSAL | Status: AC

## 2022-01-19 MED ORDER — SUGAMMADEX SODIUM 200 MG/2ML IV SOLN
INTRAVENOUS | Status: DC | PRN
  Administered 2022-01-19: 150 mg via INTRAVENOUS

## 2022-01-19 MED ORDER — SCOPOLAMINE 1 MG/3DAYS TD PT72: 1.0000 | MEDICATED_PATCH | TRANSDERMAL | Status: DC

## 2022-01-19 MED ORDER — BUPIVACAINE-EPINEPHRINE (PF) 0.25% -1:200000 IJ SOLN
INTRAMUSCULAR | Status: DC | PRN
  Administered 2022-01-19: 30 mL

## 2022-01-19 MED ORDER — LIDOCAINE HCL (PF) 2 % IJ SOLN
INTRAMUSCULAR | Status: AC
  Filled 2022-01-19: qty 5

## 2022-01-19 MED ORDER — CEFAZOLIN SODIUM-DEXTROSE 2-4 GM/100ML-% IV SOLN
2.0000 g | INTRAVENOUS | Status: AC
  Administered 2022-01-19: 2 g via INTRAVENOUS
  Filled 2022-01-19: qty 100

## 2022-01-19 MED ORDER — ROCURONIUM BROMIDE 10 MG/ML (PF) SYRINGE
PREFILLED_SYRINGE | INTRAVENOUS | Status: DC | PRN
  Administered 2022-01-19: 40 mg via INTRAVENOUS

## 2022-01-19 MED ORDER — FENTANYL CITRATE (PF) 250 MCG/5ML IJ SOLN
INTRAMUSCULAR | Status: AC
  Filled 2022-01-19: qty 5

## 2022-01-19 MED ORDER — CHLORHEXIDINE GLUCONATE 0.12 % MT SOLN
15.0000 mL | Freq: Once | OROMUCOSAL | Status: AC
  Administered 2022-01-19: 15 mL via OROMUCOSAL

## 2022-01-19 MED ORDER — MIDAZOLAM HCL 2 MG/2ML IJ SOLN
INTRAMUSCULAR | Status: AC
  Filled 2022-01-19: qty 2

## 2022-01-19 MED ORDER — LACTATED RINGERS IR SOLN
Status: DC | PRN
  Administered 2022-01-19: 1000 mL

## 2022-01-19 MED ORDER — OXYCODONE HCL 5 MG/5ML PO SOLN: 5.0000 mg | Freq: Once | ORAL | Status: DC | PRN

## 2022-01-19 MED ORDER — GABAPENTIN 300 MG PO CAPS
300.0000 mg | ORAL_CAPSULE | ORAL | Status: AC
  Administered 2022-01-19: 300 mg via ORAL
  Filled 2022-01-19: qty 1

## 2022-01-19 MED ORDER — LIDOCAINE HCL (CARDIAC) PF 100 MG/5ML IV SOSY
PREFILLED_SYRINGE | INTRAVENOUS | Status: DC | PRN
  Administered 2022-01-19: 60 mg via INTRAVENOUS

## 2022-01-19 MED ORDER — CHLORHEXIDINE GLUCONATE CLOTH 2 % EX PADS: 6.0000 | MEDICATED_PAD | Freq: Once | CUTANEOUS | Status: DC

## 2022-01-19 MED ORDER — ONDANSETRON HCL 4 MG/2ML IJ SOLN
INTRAMUSCULAR | Status: DC | PRN
Start: 2022-01-19 — End: 2022-01-19
  Administered 2022-01-19: 4 mg via INTRAVENOUS

## 2022-01-19 MED ORDER — PROPOFOL 10 MG/ML IV BOLUS
INTRAVENOUS | Status: AC
  Filled 2022-01-19: qty 20

## 2022-01-19 MED ORDER — PROPOFOL 10 MG/ML IV BOLUS
INTRAVENOUS | Status: DC | PRN
  Administered 2022-01-19: 20 mg via INTRAVENOUS
  Administered 2022-01-19: 100 mg via INTRAVENOUS

## 2022-01-19 MED ORDER — MEPERIDINE HCL 50 MG/ML IJ SOLN: 6.2500 mg | INTRAMUSCULAR | Status: DC | PRN

## 2022-01-19 MED ORDER — BUPIVACAINE-EPINEPHRINE (PF) 0.25% -1:200000 IJ SOLN
INTRAMUSCULAR | Status: AC
  Filled 2022-01-19: qty 30

## 2022-01-19 MED ORDER — ACETAMINOPHEN 500 MG PO TABS
1000.0000 mg | ORAL_TABLET | ORAL | Status: AC
  Administered 2022-01-19: 1000 mg via ORAL
  Filled 2022-01-19: qty 2

## 2022-01-19 MED ORDER — LACTATED RINGERS IV SOLN: INTRAVENOUS | Status: DC

## 2022-01-19 MED ORDER — MIDAZOLAM HCL 2 MG/2ML IJ SOLN
INTRAMUSCULAR | Status: DC | PRN
  Administered 2022-01-19: 2 mg via INTRAVENOUS

## 2022-01-19 MED ORDER — OXYCODONE HCL 5 MG PO TABS: 5.0000 mg | ORAL_TABLET | Freq: Once | ORAL | Status: DC | PRN

## 2022-01-19 MED ORDER — OXYCODONE-ACETAMINOPHEN 5-325 MG PO TABS
1.0000 | ORAL_TABLET | ORAL | 0 refills | Status: DC | PRN
Start: 2022-01-19 — End: 2022-02-24

## 2022-01-19 MED ORDER — MIDAZOLAM HCL 2 MG/2ML IJ SOLN: 0.5000 mg | Freq: Once | INTRAMUSCULAR | Status: DC | PRN

## 2022-01-19 MED ORDER — ROCURONIUM BROMIDE 10 MG/ML (PF) SYRINGE
PREFILLED_SYRINGE | INTRAVENOUS | Status: AC
  Filled 2022-01-19: qty 10

## 2022-01-19 MED ORDER — 0.9 % SODIUM CHLORIDE (POUR BTL) OPTIME
TOPICAL | Status: DC | PRN
  Administered 2022-01-19: 1000 mL

## 2022-01-19 MED ORDER — DEXAMETHASONE SODIUM PHOSPHATE 10 MG/ML IJ SOLN
INTRAMUSCULAR | Status: DC | PRN
  Administered 2022-01-19: 5 mg via INTRAVENOUS

## 2022-01-19 MED ORDER — FENTANYL CITRATE (PF) 250 MCG/5ML IJ SOLN
INTRAMUSCULAR | Status: DC | PRN
  Administered 2022-01-19: 50 ug via INTRAVENOUS
  Administered 2022-01-19: 100 ug via INTRAVENOUS
  Administered 2022-01-19 (×2): 50 ug via INTRAVENOUS

## 2022-01-19 SURGICAL SUPPLY — 47 items
APPLIER CLIP 5 13 M/L LIGAMAX5 (MISCELLANEOUS)
APPLIER CLIP ROT 10 11.4 M/L (STAPLE)
BAG COUNTER SPONGE SURGICOUNT (BAG) ×1 IMPLANT
CABLE HIGH FREQUENCY MONO STRZ (ELECTRODE) ×1 IMPLANT
CHLORAPREP W/TINT 26 (MISCELLANEOUS) ×2 IMPLANT
CLIP APPLIE 5 13 M/L LIGAMAX5 (MISCELLANEOUS) IMPLANT
CLIP APPLIE ROT 10 11.4 M/L (STAPLE) IMPLANT
CUTTER FLEX LINEAR 45M (STAPLE) ×1 IMPLANT
DERMABOND ADVANCED (GAUZE/BANDAGES/DRESSINGS) ×1
DERMABOND ADVANCED .7 DNX12 (GAUZE/BANDAGES/DRESSINGS) ×1 IMPLANT
DRAIN CHANNEL 19F RND (DRAIN) IMPLANT
ELECT REM PT RETURN 15FT ADLT (MISCELLANEOUS) ×2 IMPLANT
ENDOLOOP SUT PDS II  0 18 (SUTURE)
ENDOLOOP SUT PDS II 0 18 (SUTURE) IMPLANT
EVACUATOR SILICONE 100CC (DRAIN) IMPLANT
GLOVE BIO SURGEON STRL SZ7.5 (GLOVE) ×2 IMPLANT
GLOVE INDICATOR 8.0 STRL GRN (GLOVE) ×2 IMPLANT
GOWN STRL REUS W/ TWL XL LVL3 (GOWN DISPOSABLE) ×2 IMPLANT
GOWN STRL REUS W/TWL XL LVL3 (GOWN DISPOSABLE) ×2
GRASPER SUT TROCAR 14GX15 (MISCELLANEOUS) ×1 IMPLANT
IRRIG SUCT STRYKERFLOW 2 WTIP (MISCELLANEOUS) ×2
IRRIGATION SUCT STRKRFLW 2 WTP (MISCELLANEOUS) ×2 IMPLANT
KIT BASIN OR (CUSTOM PROCEDURE TRAY) ×2 IMPLANT
KIT TURNOVER KIT A (KITS) ×1 IMPLANT
NDL INSUFFLATION 14GA 120MM (NEEDLE) ×1 IMPLANT
NEEDLE INSUFFLATION 14GA 120MM (NEEDLE) ×2 IMPLANT
PENCIL SMOKE EVACUATOR (MISCELLANEOUS) ×1 IMPLANT
RELOAD 45 VASCULAR/THIN (ENDOMECHANICALS) IMPLANT
RELOAD STAPLE 45 2.5 WHT GRN (ENDOMECHANICALS) IMPLANT
RELOAD STAPLE 45 3.5 BLU ETS (ENDOMECHANICALS) IMPLANT
RELOAD STAPLE 60 2.6 WHT THN (STAPLE) IMPLANT
RELOAD STAPLE TA45 3.5 REG BLU (ENDOMECHANICALS) IMPLANT
RELOAD STAPLER WHITE 60MM (STAPLE) ×1 IMPLANT
SCISSORS LAP 5X35 DISP (ENDOMECHANICALS) ×1 IMPLANT
SET TUBE SMOKE EVAC HIGH FLOW (TUBING) ×2 IMPLANT
SLEEVE ADV FIXATION 5X100MM (TROCAR) ×2 IMPLANT
SPIKE FLUID TRANSFER (MISCELLANEOUS) ×1 IMPLANT
STAPLE ECHEON FLEX 60 POW ENDO (STAPLE) ×1 IMPLANT
STAPLER RELOAD WHITE 60MM (STAPLE) ×2
SUT ETHILON 3 0 PS 1 (SUTURE) IMPLANT
SUT MNCRL AB 4-0 PS2 18 (SUTURE) ×2 IMPLANT
TOWEL OR 17X26 10 PK STRL BLUE (TOWEL DISPOSABLE) IMPLANT
TRAY FOLEY MTR SLVR 14FR STAT (SET/KITS/TRAYS/PACK) IMPLANT
TRAY FOLEY MTR SLVR 16FR STAT (SET/KITS/TRAYS/PACK) IMPLANT
TRAY LAPAROSCOPIC (CUSTOM PROCEDURE TRAY) ×2 IMPLANT
TROCAR ADV FIXATION 5X100MM (TROCAR) ×2 IMPLANT
TROCAR KII 12X100 BLADELESS (ENDOMECHANICALS) ×2 IMPLANT

## 2022-01-19 NOTE — Anesthesia Postprocedure Evaluation (Signed)
Anesthesia Post Note ? ?Patient: Cheryl Mata ? ?Procedure(s) Performed: APPENDECTOMY LAPAROSCOPIC ? ?  ? ?Patient location during evaluation: PACU ?Anesthesia Type: General ?Level of consciousness: awake and alert, patient cooperative and oriented ?Pain management: pain level controlled ?Vital Signs Assessment: post-procedure vital signs reviewed and stable ?Respiratory status: spontaneous breathing, nonlabored ventilation and respiratory function stable ?Cardiovascular status: blood pressure returned to baseline and stable ?Postop Assessment: no apparent nausea or vomiting, able to ambulate and adequate PO intake ?Anesthetic complications: no ? ? ?No notable events documented. ? ?Last Vitals:  ?Vitals:  ? 01/19/22 1643 01/19/22 1645  ?BP: (!) 147/78 (!) 147/78  ?Pulse: 67 72  ?Resp: 12 20  ?Temp:  36.6 ?C  ?SpO2: 100% 100%  ?  ?Last Pain:  ?Vitals:  ? 01/19/22 1645  ?TempSrc:   ?PainSc: 0-No pain  ? ? ?  ?  ?  ?  ?  ?  ? ?Jerolene Kupfer,E. Leno Mathes ? ? ? ? ?

## 2022-01-19 NOTE — Anesthesia Preprocedure Evaluation (Addendum)
Anesthesia Evaluation  ?Patient identified by MRN, date of birth, ID band ?Patient awake ? ? ? ?Reviewed: ?Allergy & Precautions, NPO status , Patient's Chart, lab work & pertinent test results ? ?History of Anesthesia Complications ?Negative for: history of anesthetic complications ? ?Airway ?Mallampati: II ? ?TM Distance: >3 FB ?Neck ROM: Full ? ? ? Dental ? ?(+) Implants, Caps, Dental Advisory Given ?  ?Pulmonary ?asthma , COPD,  COPD inhaler,  ?  ?breath sounds clear to auscultation ? ? ? ? ? ? Cardiovascular ?negative cardio ROS ? ? ?Rhythm:Regular Rate:Normal ? ? ?  ?Neuro/Psych ?negative neurological ROS ?   ? GI/Hepatic ?negative GI ROS, Neg liver ROS,   ?Endo/Other  ?negative endocrine ROS ? Renal/GU ?negative Renal ROS  ? ?  ?Musculoskeletal ? ? Abdominal ?  ?Peds ? Hematology ?negative hematology ROS ?(+)   ?Anesthesia Other Findings ? ? Reproductive/Obstetrics ? ?  ? ? ? ? ? ? ? ? ? ? ? ? ? ?  ?  ? ? ? ? ? ? ? ?Anesthesia Physical ?Anesthesia Plan ? ?ASA: 2 ? ?Anesthesia Plan: General  ? ?Post-op Pain Management: Ofirmev IV (intra-op)*  ? ?Induction: Intravenous ? ?PONV Risk Score and Plan: 3 and Ondansetron, Dexamethasone and Scopolamine patch - Pre-op ? ?Airway Management Planned: Oral ETT ? ?Additional Equipment: None ? ?Intra-op Plan:  ? ?Post-operative Plan: Extubation in OR ? ?Informed Consent: I have reviewed the patients History and Physical, chart, labs and discussed the procedure including the risks, benefits and alternatives for the proposed anesthesia with the patient or authorized representative who has indicated his/her understanding and acceptance.  ? ? ? ?Dental advisory given ? ?Plan Discussed with: CRNA and Surgeon ? ?Anesthesia Plan Comments:   ? ? ? ? ? ?Anesthesia Quick Evaluation ? ?

## 2022-01-19 NOTE — Op Note (Signed)
? ?Mata: Cheryl Mata (1970-09-19, [id number]) ? ?Date of Surgery: 01/19/2022  ? ?Preoperative Diagnosis: COLON POLYP ON APPENDICEAL ORIFICE  ? ?Postoperative Diagnosis: COLON POLYP ON APPENDICEAL ORIFICE  ? ?Surgical Procedure: APPENDECTOMY LAPAROSCOPIC:   ? ?Operative Team Members:  ?Surgeon(s) and Role: ?   * Breck Maryland, Nickola Major, MD - Primary  ? ?Anesthesiologist: Annye Asa, MD ?CRNA: Raenette Rover, CRNA  ? ?Anesthesia: General  ? ?Fluids:  ?Total I/O ?In: 1000 [I.V.:1000] ?Out: 10 [Blood:10] ? ?Complications: None ? ?Drains:  none  ? ?Specimen:  ?ID Type Source Tests Collected by Time Destination  ?1 : appendix Tissue PATH Other SURGICAL PATHOLOGY Rojelio Uhrich, Nickola Major, MD 01/19/2022 1535   ?2 : pelvic peritoneal implant Tissue PATH Other SURGICAL PATHOLOGY Afrah Burlison, Nickola Major, MD 01/19/2022 1546   ?  ? ?Disposition:  PACU - hemodynamically stable. ? ?Plan of Care: Discharge to home after PACU ? ? ? ?Indications for Procedure: Cheryl Mata is a 51 y.o. female who presented with abdominal pain.  History, physical and imaging was concerning for appendicitis, so laparoscopic appendectomy was recommended for the Mata.  The procedure itself, as well as the risks, benefits and alternatives were discussed with the Mata.  Risks discussed included but were not limited to the risk of bleeding, infection, damage to nearby structures, need to convert to open procedure, incisional hernia, and the need for additional procedures or surgeries.  With this discussion complete and all questions answered the Mata granted consent to proceed. ? ?Findings: Curly appendix.  White tissue implanted on bilateral pelvic sidewalls - likely scar tissue from previous gynecologic procedures - sample sent to pathology. ? ?Infection status: ?Mata: Cheryl Mata Elective Case ?Case: Elective ?Infection Present At Time Of Surgery (PATOS): None ? ? ?Description of Procedure:  ? ?On the date stated above, the Mata was  taken to the operating room suite and placed in supine positioning with the left arm tucked.  Sequential compression devices were placed on the lower extremities to prevent blood clots.  General endotracheal anesthesia was induced.  The Mata urinated just prior to surgery so a foley catheter was not placed.  Preoperative antibiotics were given within 30 minutes of incision.  The Mata's abdomen was prepped and draped in the usual sterile fashion.  A time-out was completed verifying the correct Mata, procedure, positioning and equipment needed for the case. ? ?We began by anesthetizing the skin with local anesthetic and then making a 5 mm incision just below the umbilicus.  We dissected through the subcutaneous tissues to the fascia.  The fascia was grasped and elevated using a Kocher clamp.  A Veress needle was inserted into the abdomen and the abdomen was insufflated to 15 mmHg.  A 5 mm trocar was inserted in this position under optical guidance and then the abdomen was inspected.  There was no trauma to the underlying viscera with initial trocar placement.  Any abnormal findings, other than inflammation in the right lower quadrant, are listed above in the findings section.  Two additional trocars were placed, one 5 mm trocar suprapubically and one 12 mm trocar in the left lower quadrant.  These were placed under direct vision without any trauma to the underlying viscera.   ? ?The Mata was then placed in head down, left side down positioning.  The appendix was identified and dissected free from its attachments to the abdominal wall, small intestine and cecum.  A window was created in the mesoappendix using blunt dissection.  The appendix was curled  up on itself and was mobilized off the pelvic side wall and away from the terminal ileum using the harmonic scalpel and sharp dissection.  We used the harmonic scalpel to divide the mesoappendix.  The appendix was then retracted anteriorly and we placed on 60  mm white load of the endoscopic linear stapler across the base of the cecum, as close as possible to the ileocecal valve without narrowing it.  The appendix was removed through the 12 mm port site in the left lower quadrant.  The operative field was dry on final inspection. The staple line was well formed.  There was good hemostasis at the end of the case.  At this point we directed our attention to closure.  The Mata was moved back to a level position.  The 12 mm trocar site was closed at the fascial level using an 0-vicryl on a fascial suture passer.  The abdomen was desufflated.  The skin was closed using 4-0 Monocryl and dermabond.  All sponge and needle counts were correct at the end of the case. ? ? ? ?[name], MD ?General, Bariatric, & Minimally Invasive Surgery ?[facility], [place] ? ?

## 2022-01-19 NOTE — Transfer of Care (Signed)
Immediate Anesthesia Transfer of Care Note ? ?Patient: Cheryl Mata ? ?Procedure(s) Performed: APPENDECTOMY LAPAROSCOPIC ? ?Patient Location: PACU ? ?Anesthesia Type:General ? ?Level of Consciousness: drowsy ? ?Airway & Oxygen Therapy: Patient Spontanous Breathing and Patient connected to face mask oxygen ? ?Post-op Assessment: Report given to RN and Post -op Vital signs reviewed and stable ? ?Post vital signs: Reviewed and stable ? ?Last Vitals:  ?Vitals Value Taken Time  ?BP 134/77   ?Temp    ?Pulse 78 01/19/22 1609  ?Resp 12 01/19/22 1609  ?SpO2 100 % 01/19/22 1609  ?Vitals shown include unvalidated device data. ? ?Last Pain:  ?Vitals:  ? 01/19/22 1001  ?TempSrc:   ?PainSc: 0-No pain  ?   ? ?Patients Stated Pain Goal: 3 (01/19/22 1001) ? ?Complications: No notable events documented. ?

## 2022-01-19 NOTE — H&P (Signed)
? ?Admitting Physician: Nickola Major Xsavier Seeley ? ?Service: General surgery  ? ?CC: colon polyp ? ?Subjective  ? ?HPI: ?Cheryl Mata is an 51 y.o. female who is here for appendectomy ? ?Past Medical History:  ?Diagnosis Date  ? Allergy   ? Asthma   ? IBS (irritable bowel syndrome)   ? ? ?Past Surgical History:  ?Procedure Laterality Date  ? BREAST BIOPSY  2015  ? and 2017  ? BREAST ENHANCEMENT SURGERY Bilateral 2005  ? CARPAL TUNNEL RELEASE Right 2020  ? COLONOSCOPY  10/08/2021  ? FOOT SURGERY Bilateral 2009  ? rods both  feet  with bunionectomy  and 2011  ? PARTIAL HYSTERECTOMY  2005  ? TONSILLECTOMY    ? as a child  ? TYMPANOSTOMY TUBE PLACEMENT    ? as a child  ? ? ?Family History  ?Problem Relation Age of Onset  ? Breast cancer Mother 1  ? Hypertension Mother   ? Hypertension Father   ? Colon cancer Brother 75  ?     dx'd in his 69's  ? Crohn's disease Brother   ? Colon polyps Brother   ? Colon cancer Other   ? Crohn's disease Daughter   ? Crohn's disease Cousin   ? Esophageal cancer Neg Hx   ? Rectal cancer Neg Hx   ? Stomach cancer Neg Hx   ? ? ?Social:  reports that she has never smoked. She has never used smokeless tobacco. She reports current alcohol use. She reports that she does not use drugs. ? ?Allergies:  ?Allergies  ?Allergen Reactions  ? Yellow [place]et Venom [Bee Venom] Anaphylaxis  ? ? ?Medications: ?Current Outpatient Medications  ?Medication Instructions  ? acyclovir (ZOVIRAX) 200 mg, Oral, See admin instructions, Take up to 5 times daily for cold sores  ? albuterol (VENTOLIN HFA) 108 (90 Base) MCG/ACT inhaler 2 puffs, Inhalation, Every 4 hours PRN  ? EPINEPHrine (EPIPEN 2-PAK) 0.3 mg/0.3 mL IJ SOAJ injection Use as directed for severe allergic reaction  ? levocetirizine (XYZAL) 5 MG tablet TAKE 1 TABLET(5 MG) BY MOUTH EVERY EVENING  ? ? ?ROS - all of the below systems have been reviewed with the patient and positives are indicated with bold text ?General: chills, fever or night sweats ?Eyes: blurry  vision or double vision ?ENT: epistaxis or sore throat ?Allergy/Immunology: itchy/watery eyes or nasal congestion ?Hematologic/Lymphatic: bleeding problems, blood clots or swollen lymph nodes ?Endocrine: temperature intolerance or unexpected weight changes ?Breast: new or changing breast lumps or nipple discharge ?Resp: cough, shortness of breath, or wheezing ?CV: chest pain or dyspnea on exertion ?GI: as per HPI ?GU: dysuria, trouble voiding, or hematuria ?MSK: joint pain or joint stiffness ?Neuro: TIA or stroke symptoms ?Derm: pruritus and skin lesion changes ?Psych: anxiety and depression ? ?Objective  ? ?PE ?Blood pressure 138/87, pulse 86, temperature 99.2 ?F (37.3 ?C), temperature source Oral, resp. rate 16, height '5\' 3"'$  (1.6 m), weight 54.9 kg, SpO2 99 %. ?Constitutional: NAD; conversant; no deformities ?Eyes: Moist conjunctiva; no lid lag; anicteric; PERRL ?Neck: Trachea midline; no thyromegaly ?Lungs: Normal respiratory effort; no tactile fremitus ?CV: RRR; no palpable thrills; no pitting edema ?GI: Abd Soft, nontender; no palpable hepatosplenomegaly ?MSK: Normal range of motion of extremities; no clubbing/cyanosis ?Psychiatric: Appropriate affect; alert and oriented x3 ?Lymphatic: No palpable cervical or axillary lymphadenopathy ? ?No results found for this or any previous visit (from the past 24 hour(s)). ? ?Imaging Orders  ?No imaging studies ordered today  ? ?Colonoscopy 10/08/21: ?- There was  4-77m of residual, recurrent polypoid mucosa in the cecum. Snare resected and ?sent to pathology. ?- There was new mucous capped polypoid type 'lesion' measuring 1-2cm and clearly growing ?into or from the appencideal orifice. See images. This is not endoscopicallly removal and was ?biopsied extensively. ?- The examination was otherwise normal on direct and retroflexion views. ?1. Surgical [P], colon, cecum, polyp (1) ?- SESSILE SERRATED POLYP (2 OF 3 FRAGMENTS) ?- BENIGN COLONIC MUCOSA (1 OF 3 FRAGMENTS) ?- NO  HIGH-GRADE DYSPLASIA OR MALIGNANCY IDENTIFIED ?2. Surgical [P], appendiceal oraface, polyp (1) ?- SESSILE SERRATED POLYP (4 OF 4 FRAGMENTS) ?- NO HIGH-GRADE DYSPLASIA OR MALIGNANCY IDENTIFIED ? ?Colonoscopy 12/16/20: ?- One 23 mm polyp in the cecum, removed piecemeal using a cold snare. Resected and ?retrieved. ?- The examination was otherwise normal on direct and retroflexion views. ?Surgical [P], colon, cecum, polyp (1) ?- MULTIPLE FRAGMENTS OF SESSILE SERRATED POLYP WITHOUT CYTOLOGIC DYSPLASIA.  ? ?Assessment and Plan  ? ?JTremaine Fuhrimanis an 51y.o. 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 ? ?I recommended laparoscopic appendectomy for the patient.   I explained how I would include a cuff of normal cecum to incorporate the appendiceal orifice into the specimen.  I confirmed this plan with the gastroenterologist who performed the colonoscopy. We discussed the procedure itself as well as its risk, benefits, and alternatives. The risk discussed included but were not limited to the risk of infection, bleeding, damage nearby structures, and narrowing of the ileocecal valve. After full discussion all questions answered the patient granted consent to proceed.  ? ?Our surgery scheduler will reach out to the patient to schedule surgery.  ? ?  ICD-10-CM   ?1. Pre-op testing  Z01.818 CANCELED: CBC per protocol  ?  CANCELED: Pregnancy, urine per protocol  ?  CANCELED: Pregnancy, urine per protocol  ?  ? ? ? ?PFelicie Morn MD ? ?[facility] ?Use [email or web address] to contact on call provider ? ? ? ?

## 2022-01-19 NOTE — Discharge Instructions (Signed)
 APPENDECTOMY POST OPERATIVE INSTRUCTIONS  Thinking Clearly  The anesthesia may cause you to feel different for 1 or 2 days. Do not drive, drink alcohol, or make any big decisions for at least 2 days.  Nutrition When you wake up, you will be able to drink small amounts of liquid. If you do not feel sick, you can slowly advance your diet to regular foods. Continue to drink lots of fluids, usually about 8 to 10 glasses per day. Eat a high-fiber diet so you don't strain during bowel movements. High-Fiber Foods Foods high in fiber include beans, bran cereals and whole-grain breads, peas, dried fruit (figs, apricots, and dates), raspberries, blackberries, strawberries, sweet corn, broccoli, baked potatoes with skin, plums, pears, apples, greens, and nuts. Activity Slowly increase your activity. Be sure to get up and walk every hour or so to prevent blood clots. No heavy lifting or strenuous activity for 4 weeks following surgery to prevent hernias at your incision sites It is normal to feel tired. You may need more sleep than usual.  Get your rest but make sure to get up and move around frequently to prevent blood clots and pneumonia.  Work and Return to School You can go back to work when you feel well enough. Discuss the timing with your surgeon. You can usually go back to school or work 1 week or less after an operation for an unruptured appendix and up to 2 weeks after a ruptured appendix. If your work requires heavy lifting or strenuous activity you need to be placed on light duty for 4 weeks following surgery. You can return to gym class, sports or other physical activities 4 weeks after surgery.  Wound Care Always wash your hands before and after touching near your incision site. Do not soak in a bathtub until cleared at your follow up appointment. You may take a shower 24 hours after surgery. A small amount of drainage from the incision is normal. If the drainage is thick and yellow or  the site is red, you may have an infection, so call your surgeon. If you have a drain in one of your incisions, it will be taken out in office when the drainage stops. Steri-Strips will fall off in 7 to 10 days or they will be removed during your first office visit. If you have dermabond glue covering over the incision, allow the glue to flake off on its own. Avoid wearing tight or rough clothing. It may rub your incisions and make it harder for them to heal. Protect the new skin, especially from the sun. The sun can burn and cause darker scarring. Your scar will heal in about 4 to 6 weeks and will become softer and continue to fade over the next year.  The cosmetic appearance of the incisions will improve over the course of the first year after surgery. Sensation around your incision will return in a few weeks or months.  Bowel Movements After intestinal surgery, you may have loose watery stools for several days. If watery diarrhea lasts longer than 3 days, contact your surgeon. Pain medication (narcotics) can cause constipation. Increase the fiber in your diet with high-fiber foods if you are constipated. You can take an over the counter stool softener like Colace to avoid constipation.  Additional over the counter medications can also be used if Colace isn't sufficient (for example, Milk of Magnesia or Miralax).  Pain The amount of pain is different for each person. Some people need only 1 to   3 doses of pain control medication, while others need more. Take alternating doses of tylenol and ibuprofen around the clock for the first five days following surgery.  This will provide a baseline of pain control and help with inflammation.  Take the narcotic pain medication in addition if needed for severe pain.  Contact Your Surgeon at [phone], if you have: Pain that will not go away Pain that gets worse A fever of more than 101F (38.3C) Repeated vomiting Swelling, redness, bleeding, or  bad-smelling drainage from your wound site Strong abdominal pain No bowel movement or unable to pass gas for 3 days Watery diarrhea lasting longer than 3 days  Pain Control The goal of pain control is to minimize pain, keep you moving and help you heal. Your surgical team will work with you on your pain plan. Most often a combination of therapies and medications are used to control your pain. You may also be given medication (local anesthetic) at the surgical site. This may help control your pain for several days. Extreme pain puts extra stress on your body at a time when your body needs to focus on healing. Do not wait until your pain has reached a level "10" or is unbearable before telling your doctor or nurse. It is much easier to control pain before it becomes severe. Following a laparoscopic procedure, pain is sometimes felt in the shoulder. This is due to the gas inserted into your abdomen during the procedure. Moving and walking helps to decrease the gas and the right shoulder pain.  Use the guide below for ways to manage your post-operative pain. Learn more by going to facs.org/safepaincontrol.  How Intense Is My Pain Common Therapies to Feel Better       I hardly notice my pain, and it does not interfere with my activities.  I notice my pain and it distracts me, but I can still do activities (sitting up, walking, standing).  Non-Medication Therapies  Ice (in a bag, applied over clothing at the surgical site), elevation, rest, meditation, massage, distraction (music, TV, play) walking and mild exercise Splinting the abdomen with pillows +  Non-Opioid Medications Acetaminophen (Tylenol) Non-steroidal anti-inflammatory drugs (NSAIDS) Aspirin, Ibuprofen (Motrin, Advil) Naproxen (Aleve) Take these as needed, when you feel pain. Both acetaminophen and NSAIDs help to decrease pain and swelling (inflammation).      My pain is hard to ignore and is more noticeable even when I  rest.  My pain interferes with my usual activities.  Non-Medication Therapies  +  Non-Opioid medications  Take on a regular schedule (around-the-clock) instead of as needed. (For example, Tylenol every 6 hours at 9:00 am, 3:00 pm, 9:00 pm, 3:00 am and Motrin every 6 hours at 12:00 am, 6:00 am, 12:00 pm, 6:00 pm)         I am focused on my pain, and I am not doing my daily activities.  I am groaning in pain, and I cannot sleep. I am unable to do anything.  My pain is as bad as it could be, and nothing else matters.  Non-Medication Therapies  +  Around-the-Clock Non-Opioid Medications  +  Short-acting opioids  Opioids should be used with other medications to manage severe pain. Opioids block pain and give a feeling of euphoria (feel high). Addiction, a serious side effect of opioids, is rare with short-term (a few days) use.  Examples of short-acting opioids include: Tramadol (Ultram), Hydrocodone (Norco, Vicodin), Hydromorphone (Dilaudid), Oxycodone (Oxycontin)     The above   directions have been adapted from the American College of Surgeons Surgical Patient Education Program.  Please refer to the ACS website if needed: https://www.facs.org/education/patient-education/patient-resources/operations/appendectomy.   Cheryl Karges, MD [facility] [address], [address], [place], [place]  [address] ?  [address] [address], [place], [place]   [address] [phone] ? [phone] ? FAX [phone] Web site: [email or web address]  

## 2022-01-19 NOTE — Anesthesia Procedure Notes (Signed)
Procedure Name: Intubation ?Date/Time: 01/19/2022 3:05 PM ?Performed by: Raenette Rover, CRNA ?Pre-anesthesia Checklist: Patient identified, Emergency Drugs available, Suction available and Patient being monitored ?Patient Re-evaluated:Patient Re-evaluated prior to induction ?Oxygen Delivery Method: Circle system utilized ?Preoxygenation: Pre-oxygenation with 100% oxygen ?Induction Type: IV induction ?Ventilation: Mask ventilation without difficulty ?Laryngoscope Size: Mac and 3 ?Grade View: Grade I ?Tube type: Oral ?Tube size: 7.0 mm ?Number of attempts: 1 ?Airway Equipment and Method: Stylet ?Placement Confirmation: ETT inserted through vocal cords under direct vision, positive ETCO2 and breath sounds checked- equal and bilateral ?Secured at: 21 cm ?Tube secured with: Tape ?Dental Injury: Teeth and Oropharynx as per pre-operative assessment  ? ? ? ? ?

## 2022-01-20 ENCOUNTER — Encounter (HOSPITAL_COMMUNITY): Payer: Self-pay | Admitting: Surgery

## 2022-01-21 LAB — SURGICAL PATHOLOGY

## 2022-01-28 ENCOUNTER — Telehealth: Payer: Self-pay

## 2022-01-28 NOTE — Telephone Encounter (Signed)
The recall has been entered and the pt made aware

## 2022-01-28 NOTE — Telephone Encounter (Signed)
Cheryl Banister, MD  Stechschulte, Nickola Major, MD; Timothy Lasso, RN I think that sounds like a real good plan.  We will reach out to her and make sure she knows that she is on for a repeat colonoscopy in 6 months.  thanks    Cheryl Mata,  See above, she needs recall colonoscopy in 6 months.  Can you please let her know that we have been in communication with Dr. Thermon Leyland.  Thanks

## 2022-02-24 ENCOUNTER — Encounter: Payer: Self-pay | Admitting: Allergy & Immunology

## 2022-02-24 ENCOUNTER — Ambulatory Visit (INDEPENDENT_AMBULATORY_CARE_PROVIDER_SITE_OTHER): Payer: BC Managed Care – PPO | Admitting: Allergy & Immunology

## 2022-02-24 VITALS — BP 130/72 | HR 75 | Temp 97.8°F | Resp 16 | Ht 63.0 in | Wt 129.2 lb

## 2022-02-24 DIAGNOSIS — J4520 Mild intermittent asthma, uncomplicated: Secondary | ICD-10-CM

## 2022-02-24 DIAGNOSIS — J302 Other seasonal allergic rhinitis: Secondary | ICD-10-CM

## 2022-02-24 DIAGNOSIS — J3089 Other allergic rhinitis: Secondary | ICD-10-CM | POA: Diagnosis not present

## 2022-02-24 DIAGNOSIS — T63481D Toxic effect of venom of other arthropod, accidental (unintentional), subsequent encounter: Secondary | ICD-10-CM | POA: Diagnosis not present

## 2022-02-24 DIAGNOSIS — T63481A Toxic effect of venom of other arthropod, accidental (unintentional), initial encounter: Secondary | ICD-10-CM

## 2022-02-24 MED ORDER — EPINEPHRINE 0.3 MG/0.3ML IJ SOAJ: INTRAMUSCULAR | 1 refills | Status: DC

## 2022-02-24 NOTE — Patient Instructions (Addendum)
1. Mild persistent asthma, uncomplicated - Lung testing looked amazing today. - I agree with using your Arnuity only with flares since you have been doing so well.  - Daily controller medication(s): NONE - Prior to physical activity: ProAir 2 puffs 10-15 minutes before physical activity. - Rescue medications: ProAir 4 puffs every 4-6 hours as needed - Changes during respiratory infections or worsening symptoms: Add on Arnuity 183mg to 1 puff twice daily for TWO WEEKS. - Asthma control goals:  * Full participation in all desired activities (may need albuterol before activity) * Albuterol use two time or less a week on average (not counting use with activity) * Cough interfering with sleep two time or less a month * Oral steroids no more than once a year * No hospitalizations  2. Allergic rhinitis (grasses, weeds, trees, indoor molds, dust mites, cat, dog, and horse) - Continue with Xyzal '5mg'$  once daily. - Continue with Dymista as needed.     3. 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   4. Return in about 1 year (around 02/25/2023).    Please inform [place] any Emergency Department visits, hospitalizations, or changes in symptoms. Call [place] going to the ED for breathing or allergy symptoms since we might be able to fit you in for a sick visit. Feel free to contact [place]time with any questions, problems, or concerns.  It was a pleasure to see you again today!  Websites that have reliable patient information: 1. American Academy of Asthma, Allergy, and Immunology: www.aaaai.org 2. Food Allergy Research and Education (FARE): foodallergy.org 3. Mothers of Asthmatics: http://www.asthmacommunitynetwork.org 4. American College of Allergy, Asthma, and Immunology: www.acaai.org   COVID-19 Vaccine Information can be found at: [email or web address] questions related to vaccine distribution or  appointments, please email [email or web address] or call [phone]     "Like" [facility] Facebook and Instagram for our latest updates!        Make sure you are registered to vote! If you have moved or changed any of your contact information, you will need to get this updated before voting!  In some cases, you MAY be able to register to vote online: [email or web address]

## 2022-02-24 NOTE — Progress Notes (Signed)
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 Mild persistent asthma, uncomplicated - Lung testing looked amazing today. - I agree with using your Arnuity only with flares since you have been doing so well.  - Daily controller medication(s): NONE - Prior to physical activity: ProAir 2 puffs 10-15 minutes before physical activity. - Rescue medications: ProAir 4 puffs every 4-6 hours as needed - Changes during respiratory infections or worsening symptoms: Add on Arnuity to 1 puff twice daily for TWO WEEKS. - Asthma control goals:  * Full participation in all desired activities (may need albuterol before activity) * Albuterol use two time or less a week on average (not counting use with activity) * Cough interfering with sleep two time or less a month * Oral steroids no more than once a year * No hospitalizations  2. Allergic rhinitis (grasses, weeds, trees, indoor molds, dust mites, cat, dog, and horse) - Continue with Xyzal 5mg  once daily. - Continue with Dymista as needed.     3. 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   4. Return in about 1 year (around 02/25/2023).   Subjective:   Cheryl Mata is a 51 y.o. female presenting today for follow up of  Chief Complaint  Patient presents with   Asthma    No issues since Covid   Allergic Rhinitis     Had some issues (watery itchy eyes) this spring but fall is the time when it is worse.    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  Cheryl Mata is a 51 y.o. female presenting for a follow up visit.  We last saw Cheryl Mata in July 2021.  At that time, lung testing looked great.  She was using her Arnuity only with flares which seem to be working for her.  For her allergic rhinitis, we continue with Dymista as well as Xyzal.  For her hypersensitivity, we refilled the EpiPen.  Since the last visit, she has done very well.   Asthma/Respiratory Symptom History: Asthma has been fairly well controlled. She was in the office the whole time. She is busy at work.  She does not remember the last time that she used her rescue inhaler. It might have been 2-3 years ago.   Allergic Rhinitis Symptom History: She is doing Xyzal all year long for the most part. She does not use Dymista often. Spring and fall are the worse times of the year.  She had surgery performed on her colon. She had a polyp that was coming out of her cecum and attached to the appendix. This was just seen via a routine colonoscopy. She has done very well. She did not work for almost a week.   Works has been busy. Apparently there is some FDA rule coming down the pipeline that requires more paperwork and labeling of medications. So they have a lot of unusable medications  right now.   Her daughter is getting married in September. She is going to be there for one week. She lives in [place] and the wedding is going to be in a barn.   Otherwise, there have been no changes to her past medical history, surgical history, family history, or social history.    Review of Systems  Constitutional: Negative.  Negative for chills, fever, malaise/fatigue and weight loss.  HENT: Negative.  Negative for congestion, ear discharge, ear pain, sinus pain and sore throat.   Eyes:  Negative for pain, discharge and redness.  Respiratory:  Negative for cough, sputum production,  shortness of breath and wheezing.   Cardiovascular: Negative.  Negative for chest pain and palpitations.  Gastrointestinal:  Negative for abdominal pain, constipation, diarrhea, heartburn, nausea and vomiting.  Skin: Negative.  Negative for itching and rash.  Neurological:  Negative for dizziness and headaches.  Endo/Heme/Allergies:  Negative for environmental allergies. Does not bruise/bleed easily.       Objective:   Blood pressure 130/72, pulse 75, temperature 97.8 F (36.6 C), temperature source Temporal, resp. rate 16, height 5\' 3"  (1.6 m), weight 129 lb 3.2 oz (58.6 kg), SpO2 98 %. Body mass index is 22.89 kg/m.    Physical Exam Vitals reviewed.  Constitutional:      Appearance: She is well-developed.  HENT:     Head: Normocephalic and atraumatic.     Right Ear: Tympanic membrane, ear canal and external ear normal.     Left Ear: Tympanic membrane, ear canal and external ear normal.     Nose: No nasal deformity, septal deviation, mucosal edema or rhinorrhea.     Right Turbinates: Enlarged and swollen. Not pale.     Left Turbinates: Enlarged and swollen. Not pale.     Right Sinus: No maxillary sinus tenderness or frontal sinus tenderness.     Left Sinus: No maxillary sinus tenderness or frontal sinus tenderness.     Mouth/Throat:     Mouth: Mucous membranes are not pale and not dry.     Pharynx: Uvula midline.  Eyes:     General:        Right eye: No discharge.        Left eye: No discharge.     Conjunctiva/sclera: Conjunctivae normal.     Right eye: Right conjunctiva is not injected. No chemosis.    Left eye: Left conjunctiva is not injected. No chemosis.    Pupils: Pupils are equal, round, and reactive to light.  Cardiovascular:     Rate and Rhythm: Normal rate and regular rhythm.     Heart sounds: Normal heart sounds.  Pulmonary:     Effort: Pulmonary effort is normal. No tachypnea, accessory muscle usage or respiratory distress.     Breath sounds: Normal breath  sounds. No wheezing, rhonchi or rales.  Chest:     Chest wall: No tenderness.  Lymphadenopathy:     Cervical: No cervical adenopathy.  Skin:    Coloration: Skin is not pale.     Findings: No abrasion, erythema, petechiae or rash. Rash is not papular, urticarial or vesicular.  Neurological:     Mental Status: She is alert.  Psychiatric:        Behavior: Behavior is cooperative.      Diagnostic studies:    Spirometry: results normal (FEV1: 2.28/85%, FVC: 2.83/83%, FEV1/FVC: 81%).    Spirometry consistent with normal pattern.   Allergy Studies: none        Malachi Bonds, MD  [facility]

## 2022-06-01 ENCOUNTER — Telehealth: Payer: Self-pay | Admitting: Gastroenterology

## 2022-06-01 NOTE — Telephone Encounter (Signed)
Agree with plans as outlined including office appt.

## 2022-06-01 NOTE — Telephone Encounter (Signed)
Patient called states she is having stomach issues since her procedure. States she trouble using the bathroom, and trouble passing gas. Requesting a call back as soon as possible. Please call to advise.

## 2022-06-01 NOTE — Telephone Encounter (Signed)
The pt was last seen in Feb 2023 for colon, she was referred to [facility] for appendectomy and cecectomy (May 2023).  She did very well until about 3 weeks ago when she developed constipation, bloating and gas.  She began taking miralax 1 cap daily and 2 chewable fiber (citrucel).  She states she increased her fruit and veg intake and also had increased stress with her daughter recently getting married in [place].  She has passed liquid stool about 4 times over the past 2-3 weeks with last normal BM for her 3 weeks ago.  She is passing some gas but she is scared to because of the liquid stool she has been having.  She is going to do a miralax purge with 6-8 caps and then 1 cap daily titrated to have regular BM's.  She was advised to call and give [place] an update. She was cautioned to go to the ED if she does the purge and has abd pain, not passing stool or gas or if she develops nausea and abd pain.  The pt has been advised of the information and verbalized understanding.   She has also been set up for a follow up with Anderson Malta on 10/27.    Dr Fuller Plan please review for any further req's for Dr Ardis Hughs pt as DOD.

## 2022-06-13 ENCOUNTER — Emergency Department (HOSPITAL_BASED_OUTPATIENT_CLINIC_OR_DEPARTMENT_OTHER): Payer: BC Managed Care – PPO

## 2022-06-13 ENCOUNTER — Encounter (HOSPITAL_BASED_OUTPATIENT_CLINIC_OR_DEPARTMENT_OTHER): Payer: Self-pay | Admitting: Pediatrics

## 2022-06-13 ENCOUNTER — Other Ambulatory Visit: Payer: Self-pay

## 2022-06-13 ENCOUNTER — Emergency Department (HOSPITAL_BASED_OUTPATIENT_CLINIC_OR_DEPARTMENT_OTHER)
Admission: EM | Admit: 2022-06-13 | Discharge: 2022-06-13 | Disposition: A | Discharge status: Home / Self Care | Payer: BC Managed Care – PPO | Attending: Emergency Medicine | Admitting: Emergency Medicine

## 2022-06-13 DIAGNOSIS — K5900 Constipation, unspecified: Secondary | ICD-10-CM | POA: Diagnosis not present

## 2022-06-13 DIAGNOSIS — R109 Unspecified abdominal pain: Secondary | ICD-10-CM | POA: Diagnosis not present

## 2022-06-13 LAB — URINALYSIS, ROUTINE W REFLEX MICROSCOPIC
Bilirubin Urine: NEGATIVE
Glucose, UA: NEGATIVE mg/dL
Hgb urine dipstick: NEGATIVE
Ketones, ur: NEGATIVE mg/dL
Leukocytes,Ua: NEGATIVE
Nitrite: NEGATIVE
Protein, ur: NEGATIVE mg/dL
Specific Gravity, Urine: 1.02 (ref 1.005–1.030)
pH: 7 (ref 5.0–8.0)

## 2022-06-13 LAB — CBC WITH DIFFERENTIAL/PLATELET
Abs Immature Granulocytes: 0.01 10*3/uL (ref 0.00–0.07)
Basophils Absolute: 0 10*3/uL (ref 0.0–0.1)
Basophils Relative: 1 %
Eosinophils Absolute: 0.1 10*3/uL (ref 0.0–0.5)
Eosinophils Relative: 3 %
HCT: 39.3 % (ref 36.0–46.0)
Hemoglobin: 12.9 g/dL (ref 12.0–15.0)
Immature Granulocytes: 0 %
Lymphocytes Relative: 31 %
Lymphs Abs: 1.2 10*3/uL (ref 0.7–4.0)
MCH: 27.9 pg (ref 26.0–34.0)
MCHC: 32.8 g/dL (ref 30.0–36.0)
MCV: 84.9 fL (ref 80.0–100.0)
Monocytes Absolute: 0.4 10*3/uL (ref 0.1–1.0)
Monocytes Relative: 10 %
Neutro Abs: 2.2 10*3/uL (ref 1.7–7.7)
Neutrophils Relative %: 55 %
Platelets: 199 10*3/uL (ref 150–400)
RBC: 4.63 MIL/uL (ref 3.87–5.11)
RDW: 12.3 % (ref 11.5–15.5)
WBC: 4 10*3/uL (ref 4.0–10.5)
nRBC: 0 % (ref 0.0–0.2)

## 2022-06-13 LAB — COMPREHENSIVE METABOLIC PANEL
ALT: 17 U/L (ref 0–44)
AST: 31 U/L (ref 15–41)
Albumin: 4 g/dL (ref 3.5–5.0)
Alkaline Phosphatase: 83 U/L (ref 38–126)
Anion gap: 7 (ref 5–15)
BUN: 16 mg/dL (ref 6–20)
CO2: 27 mmol/L (ref 22–32)
Calcium: 9.2 mg/dL (ref 8.9–10.3)
Chloride: 107 mmol/L (ref 98–111)
Creatinine, Ser: 1.12 mg/dL — ABNORMAL HIGH (ref 0.44–1.00)
GFR, Estimated: 60 mL/min — ABNORMAL LOW (ref >60)
Glucose, Bld: 100 mg/dL — ABNORMAL HIGH (ref 70–99)
Potassium: 3.6 mmol/L (ref 3.5–5.1)
Sodium: 141 mmol/L (ref 135–145)
Total Bilirubin: 0.4 mg/dL (ref 0.3–1.2)
Total Protein: 7.1 g/dL (ref 6.5–8.1)

## 2022-06-13 LAB — LIPASE, BLOOD: Lipase: 41 U/L (ref 11–51)

## 2022-06-13 MED ORDER — IOHEXOL 300 MG/ML  SOLN
100.0000 mL | Freq: Once | INTRAMUSCULAR | Status: AC | PRN
  Administered 2022-06-13: 100 mL via INTRAVENOUS

## 2022-06-13 NOTE — ED Triage Notes (Signed)
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 Stated she now have difficulty having a bowel movement, not passing gas and feels severe pressure in the rectal area, denies vomiting. Last bowel movement last Monday night & Tuesday morning, yesterday she tried to have a bowel movement and only a small pebble came out.

## 2022-06-13 NOTE — ED Provider Notes (Signed)
[facility] EMERGENCY DEPARTMENT Provider Note   CSN: [id number] Arrival date & time: 06/13/22  1414    History  Chief Complaint  Patient presents with   Constipation    Cheryl Mata is a 51 y.o. female hx of adenocarcinoma of cecum, appendectomy 2023 here for evaluation of rectal pain and constipation.  Initially was fine after surgery a month ago developed constipation, bloating.  She takes MiraLAX once daily and Citrucel for constipation.  Ultimately few weeks ago did a MiraLAX purge with 6-8 capfuls as well as some additional tablets and successfully had bowel movements however she over the last week has developed lower abdominal pain, constipation.  She is not passing flatus.  States she does have hemorrhoids.  No history of SBO.  No fever, nausea, vomiting.  Pain worse when she sits she feels like there is a "ball". Not on chronic opiates. No melena.   HPI     Home Medications Prior to Admission medications   Medication Sig Start Date End Date Taking? Authorizing Provider  acyclovir (ZOVIRAX) 200 MG capsule Take 200 mg by mouth See admin instructions. 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 09/03/19   Valentina Shaggy, MD  EPINEPHrine (EPIPEN 2-PAK) 0.3 mg/0.3 mL IJ SOAJ injection Use as directed for severe allergic reaction 02/24/22   Valentina Shaggy, MD  levocetirizine (XYZAL) 5 MG tablet TAKE 1 TABLET(5 MG) BY MOUTH EVERY EVENING 04/20/20   Valentina Shaggy, MD      Allergies    Yellow jacket venom [bee venom]    Review of Systems   Review of Systems  Constitutional: Negative.   HENT: Negative.    Respiratory: Negative.    Cardiovascular: Negative.   Gastrointestinal:  Positive for abdominal pain, constipation and rectal pain. Negative for abdominal distention, anal bleeding, blood in stool, diarrhea,  nausea and vomiting.  Genitourinary: Negative.   Musculoskeletal: Negative.   Skin: Negative.   Neurological: Negative.   All other systems reviewed and are negative.   Physical Exam Updated Vital Signs BP 123/89 (BP Location: Left Arm)   Pulse 70   Temp 97.9 F (36.6 C) (Oral)   Resp 20   Ht '5\' 3"'$  (1.6 m)   Wt 56.7 kg   SpO2 100%   BMI 22.14 kg/m  Physical Exam Vitals and nursing note reviewed.  Constitutional:      General: She is not in acute distress.    Appearance: She is well-developed. She is not ill-appearing, toxic-appearing or diaphoretic.  HENT:     Head: Normocephalic and atraumatic.     Nose: Nose normal.     Mouth/Throat:     Mouth: Mucous membranes are moist.  Eyes:     Pupils: Pupils are equal, round, and reactive to light.  Cardiovascular:     Rate and Rhythm: Normal rate.     Pulses: Normal pulses.     Heart sounds: Normal heart sounds.  Pulmonary:     Effort: Pulmonary effort is normal. No respiratory distress.     Breath sounds: Normal breath sounds.  Abdominal:     General: Bowel sounds are normal. There is no distension.     Palpations: Abdomen is soft.     Tenderness: There is abdominal tenderness. There is no right CVA tenderness, left CVA tenderness, guarding or rebound.  Musculoskeletal:        General: Normal range of motion.     Cervical back: Normal range of motion.  Skin:    General: Skin is warm and dry.     Capillary Refill: Capillary refill takes less than 2 seconds.  Neurological:     General: No focal deficit present.     Mental Status: She is alert and oriented to person, place, and time.  Psychiatric:        Mood and Affect: Mood normal.    ED Results / Procedures / Treatments   Labs (all labs ordered are listed, but only abnormal results are displayed) Labs Reviewed  COMPREHENSIVE METABOLIC PANEL - Abnormal; Notable for the following components:      Result Value   Glucose, Bld 100 (*)    Creatinine, Ser 1.12 (*)     GFR, Estimated 60 (*)    All other components within normal limits  CBC WITH DIFFERENTIAL/PLATELET  LIPASE, BLOOD  URINALYSIS, ROUTINE W REFLEX MICROSCOPIC    EKG None  Radiology CT ABDOMEN PELVIS W CONTRAST  Result Date: 06/13/2022 CLINICAL DATA:  Acute abdominal pain. Appendectomy in May of 2023. History of cancer. Abdominal pressure and not passing gas. Concern for small bowel obstruction or constipation. EXAM: CT ABDOMEN AND PELVIS WITH CONTRAST TECHNIQUE: Multidetector CT imaging of the abdomen and pelvis was performed using the standard protocol following bolus administration of intravenous contrast. RADIATION DOSE REDUCTION: This exam was performed according to the departmental dose-optimization program which includes automated exposure control, adjustment of the mA and/or kV according to patient size and/or use of iterative reconstruction technique. CONTRAST:  133m OMNIPAQUE IOHEXOL 300 MG/ML  SOLN COMPARISON:  None Available. FINDINGS: Lower chest: No focal airspace disease or pleural effusion. The heart is normal in size Hepatobiliary: Minimal focal fatty infiltration adjacent to the falciform ligament. No focal liver lesion. The gallbladder is decompressed, no calcified gallstone. No biliary dilatation. Pancreas: Unremarkable. No pancreatic ductal dilatation or surrounding inflammatory changes. Spleen: Normal in size without focal abnormality. Adrenals/Urinary Tract: Normal adrenal glands. No hydronephrosis or perinephric edema. Homogeneous renal enhancement with symmetric excretion on delayed phase imaging. No renal calculi or suspicious renal lesion. Urinary bladder is minimally distended, normal for degree of distension. Stomach/Bowel: Partially distended stomach, no definite gastric wall thickening. There is no small bowel obstruction or abnormal distention. No small bowel inflammation. Chain sutures at the base of the cecum. Appendectomy. Moderate volume of stool throughout the colon.  The rectum is distended at 7.8 cm. There is no rectal or colonic wall thickening no pericolonic edema. No pericolonic or perirectal collection. Vascular/Lymphatic: Normal caliber abdominal aorta. Patent portal and splenic veins. No acute vascular findings. No enlarged lymph nodes in the abdomen or pelvis. Reproductive: Status post hysterectomy. No adnexal masses. Other: No free air, free fluid, or intra-abdominal fluid collection. No abdominal wall hernia. Musculoskeletal: There are no acute or suspicious osseous abnormalities. Occasional bone islands in the pelvis. IMPRESSION: Moderate volume of stool throughout the colon with rectal distention, suggesting constipation and possible fecal impaction. No evidence of bowel obstruction or inflammation. Electronically Signed   By: MKeith RakeM.D.   On: 06/13/2022 17:29    Procedures Procedures    Medications Ordered in ED Medications  iohexol (OMNIPAQUE) 300 MG/ML solution 100 mL (100 mLs Intravenous Contrast Given 06/13/22 1711)    ED Course/ Medical Decision Making/ A&P    51year old here for evaluation of abd pain. Recent constipation and rectal pain s/p  01/19/22 removal adenocarcinoma and appendectomy. Reviewed [facility] notes from surgery. Follows with Dr. Ardis Hughs with [facility]. She is not passing flatus.  Has been difficulties with bowel movements over the last 3 to 4 weeks however resolved with some OTC MiraLAX initially.  Over the last week noted increased abdominal pain rectal pain and not passing flatus.  Denies any prior history of SBO.  Tried increasing MiraLAX at home without relief.  Feels pressure to her bottom, pain with sitting.  She has known hemorrhoids.  No large amounts of bright blood per rectum or melena.  Plan on labs, imaging and reassess  Labs and imaging personally viewed and interpreted:  CBC without leukocytosis CMP creatinine 1.12 Lipase 41 CT AP with constipation and possible fecal impaction UA neg for  infection  Patient reassessed.  I discussed her labs and imaging.  I recommended disimpaction in ED.  Patient declined.  She states she will attempt this herself at home.  I feels is reasonable.  I also recommended increasing her MiraLAX, water intake and possibly adding Dulcolax.  Encourage close follow-up with her GI provider.  She will return for worsening symptoms.  Patient is nontoxic, nonseptic appearing, in no apparent distress.  Patient's pain and other symptoms adequately managed in emergency department.  Fluid bolus given.  Labs, imaging and vitals reviewed.  Patient does not meet the SIRS or Sepsis criteria.  On repeat exam patient does not have a surgical abdomin and there are no peritoneal signs.  No indication of appendicitis, bowel obstruction, bowel perforation, cholecystitis, diverticulitis, PID, TOA, torsion or ectopic pregnancy.    The patient has been appropriately medically screened and/or stabilized in the ED. I have low suspicion for any other emergent medical condition which would require further screening, evaluation or treatment in the ED or require inpatient management.  Patient is hemodynamically stable and in no acute distress.  Patient able to ambulate in department prior to ED.  Evaluation does not show acute pathology that would require ongoing or additional emergent interventions while in the emergency department or further inpatient treatment.  I have discussed the diagnosis with the patient and answered all questions.  Pain is been managed while in the emergency department and patient has no further complaints prior to discharge.  Patient is comfortable with plan discussed in room and is stable for discharge at this time.  I have discussed strict return precautions for returning to the emergency department.  Patient was encouraged to follow-up with PCP/specialist refer to at discharge.                          Medical Decision Making Amount and/or Complexity of Data  Reviewed External Data Reviewed: labs, radiology and notes. Labs: ordered. Decision-making details documented in ED Course. Radiology: ordered and independent interpretation performed. Decision-making details documented in ED Course.  Risk OTC drugs. Prescription drug management. Parenteral controlled substances. Decision regarding hospitalization. Diagnosis or treatment significantly limited by social determinants of health.          Final Clinical Impression(s) / ED Diagnoses Final diagnoses:  Constipation, unspecified constipation type    Rx / DC Orders ED Discharge Orders     None         Maevyn Riordan A, PA-C 91/63/84 [id number]    Lianne Cure, DO 93/57/01 [id number]

## 2022-06-13 NOTE — Discharge Instructions (Addendum)
Make sure to increase your MiraLAX.  May also has Dulcolax.  Make sure to follow-up with GI.  Return for new or worsening symptoms

## 2022-07-01 ENCOUNTER — Ambulatory Visit (INDEPENDENT_AMBULATORY_CARE_PROVIDER_SITE_OTHER): Payer: BC Managed Care – PPO | Admitting: Physician Assistant

## 2022-07-01 ENCOUNTER — Encounter: Payer: Self-pay | Admitting: Physician Assistant

## 2022-07-01 VITALS — BP 130/86 | HR 78 | Ht 63.0 in | Wt 128.0 lb

## 2022-07-01 DIAGNOSIS — K649 Unspecified hemorrhoids: Secondary | ICD-10-CM | POA: Diagnosis not present

## 2022-07-01 DIAGNOSIS — K5909 Other constipation: Secondary | ICD-10-CM | POA: Diagnosis not present

## 2022-07-01 DIAGNOSIS — D120 Benign neoplasm of cecum: Secondary | ICD-10-CM | POA: Diagnosis not present

## 2022-07-01 MED ORDER — NA SULFATE-K SULFATE-MG SULF 17.5-3.13-1.6 GM/177ML PO SOLN: 1.0000 | Freq: Once | ORAL | 0 refills | Status: AC

## 2022-07-01 MED ORDER — LINACLOTIDE 72 MCG PO CAPS: 72.0000 ug | ORAL_CAPSULE | Freq: Every day | ORAL | 0 refills | Status: DC

## 2022-07-01 NOTE — Patient Instructions (Addendum)
You have been scheduled for a colonoscopy. Please follow written instructions given to you at your visit today.  Please pick up your prep supplies at the pharmacy within the next 1-3 days. If you use inhalers (even only as needed), please bring them with you on the day of your procedure.  We have sent the following medications to your pharmacy for you to pick up at your convenience: Linzess, we have given you samples to try first.  Use over the counter hydrocortisone oint. twice a day for one to two weeks.  Follow a low FODMAP diet.  I appreciate the opportunity to care for you. Ellouise Newer PA-C

## 2022-07-01 NOTE — Progress Notes (Signed)
Chief Complaint: Constipation  HPI:    Cheryl Mata is a 51 year old female with a past medical history of IBS and others listed below, known to Dr. Ardis Hughs, who was referred to me by Donald Prose, MD for a complaint of constipation.      10/08/2021 colonoscopy with 4-5 mm of residual, recurrent polypoid mucosa in the cecum, new mucus Polypoid type lesion measuring 1-2 cm and clearly growing into her from the appendiceal orifice, recommended referral to [facility] for segmental colectomy and appendectomy.  Pathology showed sessile serrated polyps.  Recall colonoscopy recommended in 3 years.    01/19/2022 laparoscopic appendectomy and cecectomy.    01/28/2022 patient contacted and told she needed a repeat colonoscopy in 6 months given surgical pathology of sessile serrated polyp/tubular adenoma.    06/01/2022 patient called in and describes she had a colonoscopy in February 2023 and was referred to [facility] for appendectomy and seek ectomy done May 2023 and did very well until about 2 months ago when she developed constipation, bloating and gas.  She started taking 1 cap of MiraLAX a day and 2 chewable fiber.  Also had increased stress with her daughter recently married in [place].  She passed a liquid stool 4 times over the past 2 to 3 weeks his last normal bowel movement 3 weeks prior, was passing some gas was scared to because a liquid stool.  She is going to do a MiraLAX purge and then 1 cap daily titrated to regular bowel movements.    06/13/2022 ER visit for constipation at that time discussed that a month prior she developed constipation and bloating and started MiraLAX once daily and Citrucel.  Eventually due to MiraLAX purge and had successful bowel movements however over the last week she had developed lower abdominal pain and constipation is not passing gas.  CMP with a glucose of 100 and creatinine 1.12 and otherwise normal, normal CBC, lipase and urinalysis.  CTAP showed moderate volume of stool throughout the colon  with rectal distention suggesting constipation and possible fecal impaction, no evidence of bowel obstruction or inflammation.  At that time disimpaction recommended in the ED but she declined and told them she would attempt at home.  Also told her to increase MiraLAX and add Dulcolax.    Today, the patient describes that she has had trouble with constipation over the past couple of months.  Initially this resolved after a MiraLAX purge as suggested by her clinic, but then she returned to the same problems.  Tells me she has always had slow bowel movements and was diagnosed with IBS in the past.  Describes that her typical is 1-2 bowel movements a week, recently though this is gotten worse and she often is also unable to pass gas.  After recent visit to the ER as above she started on MiraLAX twice a day instead of once a day and this continues to allow her to have a bowel movement maybe 1-2 times a week.  In fact, she had a good bowel movement yesterday.  Tells me it is worse though it was a terrible feeling like her body and muscles were trying to push out a stool constantly even though she could not.  Tells me she knows she is due for colonoscopy.  Also describes that eating healthily and high-fiber diet and she has been instructed previously seems to be making things worse.    Also mentions trouble with hemorrhoids being itchy.  Currently trying over-the-counter Preparation H with no help.  Denies fever, chills, weight loss or blood in her stool.  Past Medical History:  Diagnosis Date   Allergy    Asthma    IBS (irritable bowel syndrome)     Past Surgical History:  Procedure Laterality Date   BREAST BIOPSY  2015   and 2017   BREAST ENHANCEMENT SURGERY Bilateral 2005   CARPAL TUNNEL RELEASE Right 2020   COLONOSCOPY  10/08/2021   FOOT SURGERY Bilateral 2009   rods both  feet  with bunionectomy  and 2011   LAPAROSCOPIC APPENDECTOMY N/A 01/19/2022   Procedure: APPENDECTOMY LAPAROSCOPIC;   Surgeon: Felicie Morn, MD;  Location: [facility];  Service: General;  Laterality: N/A;   PARTIAL HYSTERECTOMY  2005   TONSILLECTOMY     as a child   TYMPANOSTOMY TUBE PLACEMENT     as a child    Current Outpatient Medications  Medication Sig Dispense Refill   acyclovir (ZOVIRAX) 200 MG capsule Take 200 mg by mouth See admin instructions. 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 (Patient not taking: Reported on 07/01/2022) 18 g 1   EPINEPHrine (EPIPEN 2-PAK) 0.3 mg/0.3 mL IJ SOAJ injection Use as directed for severe allergic reaction (Patient not taking: Reported on 07/01/2022) 2 each 1   levocetirizine (XYZAL) 5 MG tablet TAKE 1 TABLET(5 MG) BY MOUTH EVERY EVENING (Patient not taking: Reported on 07/01/2022) 30 tablet 5   No current facility-administered medications for this visit.    Allergies as of 07/01/2022 - Review Complete 07/01/2022  Allergen Reaction Noted   Yellow jacket venom [bee venom] Anaphylaxis 08/23/2018    Family History  Problem Relation Age of Onset   Breast cancer Mother 70   Hypertension Mother    Hypertension Father    Colon cancer Brother 8       dx'd in his 52's   Crohn's disease Brother    Colon polyps Brother    Colon cancer Other    Crohn's disease Daughter    Crohn's disease Cousin    Esophageal cancer Neg Hx    Rectal cancer Neg Hx    Stomach cancer Neg Hx     Social History   Socioeconomic History   Marital status: Married    Spouse name: Not on file   Number of children: Not on file   Years of education: Not on file   Highest education level: Not on file  Occupational History   Not on file  Tobacco Use   Smoking status: Never   Smokeless tobacco: Never  Vaping Use   Vaping Use: Never used  Substance and Sexual Activity   Alcohol use: Yes    Comment: social - occ     Drug use: No   Sexual activity: Yes  Other Topics Concern   Not on file  Social History Narrative   Not on file   Social Determinants of Health   Financial Resource Strain: Not on file  Food Insecurity: Not on file  Transportation Needs: Not on file  Physical Activity: Not on file  Stress: Not on file  Social Connections: Not on file  Intimate Partner Violence: Not on file    Review of Systems:    Constitutional: No weight loss, fever or chills Cardiovascular: No chest pain Respiratory: No SOB  Gastrointestinal: See HPI and otherwise negative  Physical Exam:  Vital signs: BP 130/86   Pulse 78   Ht '5\' 3"'$  (1.6 m)   Wt 128 lb (58.1 kg)   BMI 22.67 kg/m    Constitutional:   Pleasant Caucasian female appears to be in NAD, Well developed, Well nourished, alert and cooperative Respiratory: Respirations even and unlabored. Lungs clear to auscultation bilaterally.   No wheezes, crackles, or rhonchi.  Cardiovascular: Normal S1, S2. No MRG. Regular rate and rhythm. No peripheral edema, cyanosis or pallor.  Gastrointestinal:  Soft, nondistended, nontender. No rebound or guarding. Decreased BS. No appreciable masses or hepatomegaly. Rectal:  Not performed.  Psychiatric: Demonstrates good judgement and reason without abnormal affect or behaviors.  RELEVANT LABS AND IMAGING: CBC    Component Value Date/Time   WBC 4.0 06/13/2022 1601   RBC 4.63 06/13/2022 1601   HGB 12.9 06/13/2022 1601   HCT 39.3 06/13/2022 1601   PLT 199 06/13/2022 1601   MCV 84.9 06/13/2022 1601   MCH 27.9 06/13/2022 1601   MCHC 32.8 06/13/2022 1601   RDW 12.3 06/13/2022 1601   LYMPHSABS 1.2 06/13/2022 1601   MONOABS 0.4 06/13/2022 1601   EOSABS 0.1 06/13/2022 1601   BASOSABS 0.0 06/13/2022 1601    CMP     Component Value Date/Time   NA 141 06/13/2022 1601   K 3.6 06/13/2022 1601   CL 107 06/13/2022 1601   CO2 27 06/13/2022 1601   GLUCOSE 100 (H) 06/13/2022 1601   BUN 16 06/13/2022 1601    CREATININE 1.12 (H) 06/13/2022 1601   CALCIUM 9.2 06/13/2022 1601   PROT 7.1 06/13/2022 1601   ALBUMIN 4.0 06/13/2022 1601   AST 31 06/13/2022 1601   ALT 17 06/13/2022 1601   ALKPHOS 83 06/13/2022 1601   BILITOT 0.4 06/13/2022 1601   GFRNONAA 60 (L) 06/13/2022 1601    Assessment: 1.  Chronic constipation: Diagnosed with IBS in the past which is likely the cause +/- stricture given recent colon surgery 2.  History of appendiceal adenoma: Repeat colonoscopy recommended now  Plan: 1.  At this point patient has seen minimal benefit from MiraLAX twice a day.  We will trial Linzess 72 mcg daily 30 minutes before breakfast.  Provided her with samples and prescribed #30 with 3 refills.  Discussed that there are 3 dosages of this medication and we can increase if she does not find it beneficial. 2.  Scheduled patient for surveillance colonoscopy in the [facility] with Dr. Bryan Lemma (in Dr. Ardis Hughs absence).  Did provide the patient a detailed list of risks for the procedure and she agrees to proceed.  She will have a 2-day bowel prep. Patient is appropriate for endoscopic procedure(s) in the ambulatory ([facility]) setting. 3.  Prescribed Hydorcortisone cream to be applied to hemorrhoids twice daily for the next 1 to 2 weeks. 4.  Patient to follow in clinic per recommendations after above.  Note sent to Dr. Bryan Lemma and Dr. Ardis Hughs absence.  Ellouise Newer, PA-C [facility] 07/01/2022, 10:26 AM  Cc: Donald Prose, MD

## 2022-07-05 DIAGNOSIS — D229 Melanocytic nevi, unspecified: Secondary | ICD-10-CM | POA: Diagnosis not present

## 2022-07-05 DIAGNOSIS — L814 Other melanin hyperpigmentation: Secondary | ICD-10-CM | POA: Diagnosis not present

## 2022-07-05 DIAGNOSIS — D2272 Melanocytic nevi of left lower limb, including hip: Secondary | ICD-10-CM | POA: Diagnosis not present

## 2022-07-05 DIAGNOSIS — L821 Other seborrheic keratosis: Secondary | ICD-10-CM | POA: Diagnosis not present

## 2022-07-05 DIAGNOSIS — L603 Nail dystrophy: Secondary | ICD-10-CM | POA: Diagnosis not present

## 2022-07-06 NOTE — Progress Notes (Signed)
Agree with the assessment and plan as outlined by Jennifer Lemmon, PA-C. ? ?Lavonte Palos, DO, FACG ? ?

## 2022-07-11 DIAGNOSIS — M25561 Pain in right knee: Secondary | ICD-10-CM | POA: Diagnosis not present

## 2022-07-11 DIAGNOSIS — M9903 Segmental and somatic dysfunction of lumbar region: Secondary | ICD-10-CM | POA: Diagnosis not present

## 2022-07-11 DIAGNOSIS — M9906 Segmental and somatic dysfunction of lower extremity: Secondary | ICD-10-CM | POA: Diagnosis not present

## 2022-07-11 DIAGNOSIS — M546 Pain in thoracic spine: Secondary | ICD-10-CM | POA: Diagnosis not present

## 2022-07-11 DIAGNOSIS — M25551 Pain in right hip: Secondary | ICD-10-CM | POA: Diagnosis not present

## 2022-07-11 DIAGNOSIS — M41126 Adolescent idiopathic scoliosis, lumbar region: Secondary | ICD-10-CM | POA: Diagnosis not present

## 2022-07-15 DIAGNOSIS — M25551 Pain in right hip: Secondary | ICD-10-CM | POA: Diagnosis not present

## 2022-07-15 DIAGNOSIS — M9903 Segmental and somatic dysfunction of lumbar region: Secondary | ICD-10-CM | POA: Diagnosis not present

## 2022-07-15 DIAGNOSIS — M9906 Segmental and somatic dysfunction of lower extremity: Secondary | ICD-10-CM | POA: Diagnosis not present

## 2022-07-18 DIAGNOSIS — M9903 Segmental and somatic dysfunction of lumbar region: Secondary | ICD-10-CM | POA: Diagnosis not present

## 2022-07-18 DIAGNOSIS — M25551 Pain in right hip: Secondary | ICD-10-CM | POA: Diagnosis not present

## 2022-07-18 DIAGNOSIS — M9906 Segmental and somatic dysfunction of lower extremity: Secondary | ICD-10-CM | POA: Diagnosis not present

## 2022-07-19 DIAGNOSIS — M25551 Pain in right hip: Secondary | ICD-10-CM | POA: Diagnosis not present

## 2022-07-19 DIAGNOSIS — M9906 Segmental and somatic dysfunction of lower extremity: Secondary | ICD-10-CM | POA: Diagnosis not present

## 2022-07-19 DIAGNOSIS — M9903 Segmental and somatic dysfunction of lumbar region: Secondary | ICD-10-CM | POA: Diagnosis not present

## 2022-07-25 DIAGNOSIS — M9903 Segmental and somatic dysfunction of lumbar region: Secondary | ICD-10-CM | POA: Diagnosis not present

## 2022-07-25 DIAGNOSIS — M9906 Segmental and somatic dysfunction of lower extremity: Secondary | ICD-10-CM | POA: Diagnosis not present

## 2022-07-25 DIAGNOSIS — M25551 Pain in right hip: Secondary | ICD-10-CM | POA: Diagnosis not present

## 2022-07-26 DIAGNOSIS — M9903 Segmental and somatic dysfunction of lumbar region: Secondary | ICD-10-CM | POA: Diagnosis not present

## 2022-07-26 DIAGNOSIS — M25551 Pain in right hip: Secondary | ICD-10-CM | POA: Diagnosis not present

## 2022-07-26 DIAGNOSIS — M9906 Segmental and somatic dysfunction of lower extremity: Secondary | ICD-10-CM | POA: Diagnosis not present

## 2022-07-27 ENCOUNTER — Encounter: Payer: Self-pay | Admitting: Gastroenterology

## 2022-07-27 DIAGNOSIS — J019 Acute sinusitis, unspecified: Secondary | ICD-10-CM | POA: Diagnosis not present

## 2022-08-08 ENCOUNTER — Encounter: Payer: Self-pay | Admitting: Gastroenterology

## 2022-08-08 ENCOUNTER — Ambulatory Visit (AMBULATORY_SURGERY_CENTER): Payer: BC Managed Care – PPO | Admitting: Gastroenterology

## 2022-08-08 VITALS — BP 133/82 | HR 74 | Temp 98.4°F | Resp 14 | Ht 63.0 in | Wt 128.0 lb

## 2022-08-08 DIAGNOSIS — D125 Benign neoplasm of sigmoid colon: Secondary | ICD-10-CM | POA: Diagnosis not present

## 2022-08-08 DIAGNOSIS — K635 Polyp of colon: Secondary | ICD-10-CM | POA: Diagnosis not present

## 2022-08-08 DIAGNOSIS — K5909 Other constipation: Secondary | ICD-10-CM | POA: Diagnosis not present

## 2022-08-08 DIAGNOSIS — K640 First degree hemorrhoids: Secondary | ICD-10-CM | POA: Diagnosis not present

## 2022-08-08 DIAGNOSIS — K649 Unspecified hemorrhoids: Secondary | ICD-10-CM

## 2022-08-08 DIAGNOSIS — D120 Benign neoplasm of cecum: Secondary | ICD-10-CM

## 2022-08-08 MED ORDER — SODIUM CHLORIDE 0.9 % IV SOLN: 500.0000 mL | INTRAVENOUS | Status: DC

## 2022-08-08 NOTE — Progress Notes (Signed)
GASTROENTEROLOGY PROCEDURE H&P NOTE   Primary Care Physician: Donald Prose, MD    Reason for Procedure:   Colon polyp surveillance, constipation  Plan:    Colonoscopy  Patient is appropriate for endoscopic procedure(s) in the ambulatory ([place]) setting.  The nature of the procedure, as well as the risks, benefits, and alternatives were carefully and thoroughly reviewed with the patient. Ample time for discussion and questions allowed. The patient understood, was satisfied, and agreed to proceed.     HPI: Cheryl Mata is a 51 y.o. female who presents for colonoscopy for polyp surveillance and eval of ongoing chronic constipation.   Past Medical History:  Diagnosis Date   Allergy    Asthma    IBS (irritable bowel syndrome)     Past Surgical History:  Procedure Laterality Date   BREAST BIOPSY  2015   and 2017   BREAST ENHANCEMENT SURGERY Bilateral 2005   CARPAL TUNNEL RELEASE Right 2020   COLONOSCOPY  10/08/2021   FOOT SURGERY Bilateral 2009   rods both  feet  with bunionectomy  and 2011   LAPAROSCOPIC APPENDECTOMY N/A 01/19/2022   Procedure: APPENDECTOMY LAPAROSCOPIC;  Surgeon: Felicie Morn, MD;  Location: [facility];  Service: General;  Laterality: N/A;   PARTIAL HYSTERECTOMY  2005   TONSILLECTOMY     as a child   TYMPANOSTOMY TUBE PLACEMENT     as a child    Prior to Admission medications   Medication Sig Start Date End Date Taking? Authorizing Provider  linaclotide Rolan Lipa) 72 MCG capsule Take 1 capsule (72 mcg total) by mouth daily before breakfast. 07/01/22  Yes Lemmon, Lavone Nian, PA  acyclovir (ZOVIRAX) 200 MG capsule Take 200 mg by mouth See admin instructions. Take up to 5 times daily for cold sores Patient not taking: Reported on 07/01/2022    [provider]  albuterol (VENTOLIN HFA) 108 (90 Base) MCG/ACT inhaler Inhale 2 puffs into the lungs every 4 (four) hours as needed. Patient not taking: Reported on 07/01/2022 09/03/19   Valentina Shaggy, MD  EPINEPHrine (EPIPEN 2-PAK) 0.3 mg/0.3 mL IJ SOAJ injection Use as directed for severe allergic reaction Patient not taking: Reported on 07/01/2022 02/24/22   Valentina Shaggy, MD  levocetirizine (XYZAL) 5 MG tablet TAKE 1 TABLET(5 MG) BY MOUTH EVERY EVENING Patient not taking: Reported on 07/01/2022 04/20/20   Valentina Shaggy, MD    Current Outpatient Medications  Medication Sig Dispense Refill   linaclotide (LINZESS) 72 MCG capsule Take 1 capsule (72 mcg total) by mouth daily before breakfast. 90 capsule 0   acyclovir (ZOVIRAX) 200 MG capsule Take 200 mg by mouth See admin instructions. 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 (Patient not taking: Reported on 07/01/2022) 18 g 1   EPINEPHrine (EPIPEN 2-PAK) 0.3 mg/0.3 mL IJ SOAJ injection Use as directed for severe allergic reaction (Patient not taking: Reported on 07/01/2022) 2 each 1   levocetirizine (XYZAL) 5 MG tablet TAKE 1 TABLET(5 MG) BY MOUTH EVERY EVENING (Patient not taking: Reported on 07/01/2022) 30 tablet 5   Current Facility-Administered Medications  Medication Dose Route Frequency Provider Last Rate Last Admin   0.9 %  sodium chloride infusion  500 mL Intravenous Continuous Randel Hargens V, DO        Allergies as of 08/08/2022 -  Review Complete 08/08/2022  Allergen Reaction Noted   Yellow jacket venom [bee venom] Anaphylaxis 08/23/2018    Family History  Problem Relation Age of Onset   Breast cancer Mother 64   Hypertension Mother    Hypertension Father    Colon cancer Brother 73       dx'd in his 4's   Crohn's disease Brother    Colon polyps Brother    Colon cancer Other    Crohn's disease Daughter    Crohn's disease Cousin    Esophageal cancer Neg Hx    Rectal cancer Neg Hx    Stomach cancer Neg Hx     Social History    Socioeconomic History   Marital status: Married    Spouse name: Not on file   Number of children: Not on file   Years of education: Not on file   Highest education level: Not on file  Occupational History   Not on file  Tobacco Use   Smoking status: Never   Smokeless tobacco: Never  Vaping Use   Vaping Use: Never used  Substance and Sexual Activity   Alcohol use: Yes    Comment: social - occ    Drug use: No   Sexual activity: Yes  Other Topics Concern   Not on file  Social History Narrative   Not on file   Social Determinants of Health   Financial Resource Strain: Not on file  Food Insecurity: Not on file  Transportation Needs: Not on file  Physical Activity: Not on file  Stress: Not on file  Social Connections: Not on file  Intimate Partner Violence: Not on file    Physical Exam: Vital signs in last 24 hours: '@BP'$  139/85   Pulse 87   Temp 98.4 F (36.9 C)   Resp 19   Ht '5\' 3"'$  (1.6 m)   Wt 128 lb (58.1 kg)   SpO2 99%   BMI 22.67 kg/m  GEN: NAD EYE: Sclerae anicteric ENT: MMM CV: Non-tachycardic Pulm: CTA b/l GI: Soft, NT/ND NEURO:  [facility] x [place], DO [facility]   08/08/2022 2:03 PM

## 2022-08-08 NOTE — Progress Notes (Signed)
Report to PACU, RN, vss, BBS= Clear.  

## 2022-08-08 NOTE — Progress Notes (Signed)
Pt's states no medical or surgical changes since previsit or office visit. 

## 2022-08-08 NOTE — Op Note (Signed)
[place] Patient Name: Cheryl Mata Procedure Date: 08/08/2022 1:58 PM MRN: [id number] Endoscopist: Gerrit Heck , MD, [id number] Age: 51 Referring MD:  Date of Birth: [birth year] Gender: Female Account #: [id number] Procedure:                Colonoscopy Indications:              High risk colon cancer surveillance: Personal                            history of sessile serrated colon polyp (10 mm or                            greater in size)                           Colonoscopy in 12/2020 with large cecal SSP removed                            with snare. Repeat colonoscopy in 10/2021 with                            residual SSP, removed with snare, and an additional                            serrated polyp extending into the appendix.                            Udnerwent extended appendecomy with cecal cuff in                            01/2022 with clear margins. referred for repeat                            colonoscopy for short interval surveillance.                            Additionally, she has been having constipation.                            Additionally, family history notable for brother                            with CRC in his 56's. Medicines:                Monitored Anesthesia Care Procedure:                Pre-Anesthesia Assessment:                           - Prior to the procedure, a History and Physical                            was performed, and patient medications and  allergies were reviewed. The patient's tolerance of                            previous anesthesia was also reviewed. The risks                            and benefits of the procedure and the sedation                            options and risks were discussed with the patient.                            All questions were answered, and informed consent                            was obtained. Prior Anticoagulants: The patient has                             taken no anticoagulant or antiplatelet agents. ASA                            Grade Assessment: II - A patient with mild systemic                            disease. After reviewing the risks and benefits,                            the patient was deemed in satisfactory condition to                            undergo the procedure.                           After obtaining informed consent, the colonoscope                            was passed under direct vision. Throughout the                            procedure, the patient's blood pressure, pulse, and                            oxygen saturations were monitored continuously. The                            Olympus PCF-H190DL (#[id number]) Colonoscope was                            introduced through the anus and advanced to the the                            cecum, identified by the ileocecal valve. The  colonoscopy was technically difficult and complex                            due to significant looping. The patient tolerated                            the procedure well. The quality of the bowel                            preparation was good. The ileocecal valve and the                            rectum were photographed. Scope In: 2:11:20 PM Scope Out: 2:34:34 PM Scope Withdrawal Time: 0 hours 16 minutes 50 seconds  Total Procedure Duration: 0 hours 23 minutes 14 seconds  Findings:                 Skin tags were found on perianal exam.                           A 5 mm polyp was found in the cecum. The polyp was                            sessile and located adjacent to a prior scar. The                            polyp was removed with a cold snare. Resection and                            retrieval were complete. Estimated blood loss was                            minimal.                           A 2 mm polyp was found in the cecum. The polyp was                            sessile. The polyp  was removed with a cold snare.                            Resection and retrieval were complete. Estimated                            blood loss was minimal. The cecum otherwise had                            post surgical changes without a clearly                            identifiable appendiceal orifice, consistent with                            history  of extended appendectomy with cecal cuff.                           A 4 mm polyp was found in the sigmoid colon. The                            polyp was sessile. The polyp was removed with a                            cold snare. Resection and retrieval were complete.                            Estimated blood loss was minimal.                           Non-bleeding internal hemorrhoids were found during                            retroflexion. The hemorrhoids were small. Complications:            No immediate complications. Estimated Blood Loss:     Estimated blood loss was minimal. Impression:               - Perianal skin tags found on perianal exam.                           - One 5 mm polyp in the cecum, removed with a cold                            snare. Resected and retrieved.                           - One 2 mm polyp in the cecum, removed with a cold                            snare. Resected and retrieved.                           - One 4 mm polyp in the sigmoid colon, removed with                            a cold snare. Resected and retrieved.                           - Non-bleeding internal hemorrhoids. Recommendation:           - Patient has a contact number available for                            emergencies. The signs and symptoms of potential                            delayed complications were discussed with the  patient. Return to normal activities tomorrow.                            Written discharge instructions were provided to the                            patient.                            - Resume previous diet.                           - Continue present medications.                           - Await pathology results.                           - Repeat colonoscopy in 3 years for surveillance,                            or sooner based on pathology results.                           - Return to GI clinic in 6 months, or sooner prn. Gerrit Heck, MD 08/08/2022 2:48:04 PM

## 2022-08-08 NOTE — Patient Instructions (Signed)
Discharge instructions given. Handouts on polyps,diverticulosis and hemorrhoids. Resume previous medications. YOU HAD AN ENDOSCOPIC PROCEDURE TODAY AT THE [facility]:   Refer to the procedure report that was given to you for any specific questions about what was found during the examination.  If the procedure report does not answer your questions, please call your gastroenterologist to clarify.  If you requested that your care partner not be given the details of your procedure findings, then the procedure report has been included in a sealed envelope for you to review at your convenience later.  YOU SHOULD EXPECT: Some feelings of bloating in the abdomen. Passage of more gas than usual.  Walking can help get rid of the air that was put into your GI tract during the procedure and reduce the bloating. If you had a lower endoscopy (such as a colonoscopy or flexible sigmoidoscopy) you may notice spotting of blood in your stool or on the toilet paper. If you underwent a bowel prep for your procedure, you may not have a normal bowel movement for a few days.  Please Note:  You might notice some irritation and congestion in your nose or some drainage.  This is from the oxygen used during your procedure.  There is no need for concern and it should clear up in a day or so.  SYMPTOMS TO REPORT IMMEDIATELY:  Following lower endoscopy (colonoscopy or flexible sigmoidoscopy):  Excessive amounts of blood in the stool  Significant tenderness or worsening of abdominal pains  Swelling of the abdomen that is new, acute  Fever of 100F or higher   For urgent or emergent issues, a gastroenterologist can be reached at any hour by calling [phone]. Do not use MyChart messaging for urgent concerns.    DIET:  We do recommend a small meal at first, but then you may proceed to your regular diet.  Drink plenty of fluids but you should avoid alcoholic beverages for 24 hours.  ACTIVITY:  You should  plan to take it easy for the rest of today and you should NOT DRIVE or use heavy machinery until tomorrow (because of the sedation medicines used during the test).    FOLLOW UP: Our staff will call the number listed on your records the next business day following your procedure.  We will call around 7:15- 8:00 am to check on you and address any questions or concerns that you may have regarding the information given to you following your procedure. If we do not reach you, we will leave a message.     If any biopsies were taken you will be contacted by phone or by letter within the next 1-3 weeks.  Please call us at [phone] if you have not heard about the biopsies in 3 weeks.    SIGNATURES/CONFIDENTIALITY: You and/or your care partner have signed paperwork which will be entered into your electronic medical record.  These signatures attest to the fact that that the information above on your After Visit Summary has been reviewed and is understood.  Full responsibility of the confidentiality of this discharge information lies with you and/or your care-partner. 

## 2022-08-08 NOTE — Progress Notes (Signed)
Called to room to assist during endoscopic procedure.  Patient ID and intended procedure confirmed with present staff. Received instructions for my participation in the procedure from the performing physician.  

## 2022-08-09 ENCOUNTER — Telehealth: Payer: Self-pay

## 2022-08-09 NOTE — Telephone Encounter (Signed)
Left message on follow up call. 

## 2022-08-10 DIAGNOSIS — M25551 Pain in right hip: Secondary | ICD-10-CM | POA: Diagnosis not present

## 2022-08-10 DIAGNOSIS — M9903 Segmental and somatic dysfunction of lumbar region: Secondary | ICD-10-CM | POA: Diagnosis not present

## 2022-08-10 DIAGNOSIS — M9906 Segmental and somatic dysfunction of lower extremity: Secondary | ICD-10-CM | POA: Diagnosis not present

## 2022-08-11 ENCOUNTER — Encounter: Payer: Self-pay | Admitting: Allergy & Immunology

## 2022-08-11 ENCOUNTER — Ambulatory Visit (INDEPENDENT_AMBULATORY_CARE_PROVIDER_SITE_OTHER): Payer: BC Managed Care – PPO | Admitting: Allergy & Immunology

## 2022-08-11 VITALS — BP 118/88 | HR 74 | Temp 98.4°F | Resp 20 | Ht 64.0 in | Wt 129.4 lb

## 2022-08-11 DIAGNOSIS — J4531 Mild persistent asthma with (acute) exacerbation: Secondary | ICD-10-CM

## 2022-08-11 DIAGNOSIS — T63481D Toxic effect of venom of other arthropod, accidental (unintentional), subsequent encounter: Secondary | ICD-10-CM | POA: Diagnosis not present

## 2022-08-11 DIAGNOSIS — J3089 Other allergic rhinitis: Secondary | ICD-10-CM | POA: Diagnosis not present

## 2022-08-11 DIAGNOSIS — M9903 Segmental and somatic dysfunction of lumbar region: Secondary | ICD-10-CM | POA: Diagnosis not present

## 2022-08-11 DIAGNOSIS — M25551 Pain in right hip: Secondary | ICD-10-CM | POA: Diagnosis not present

## 2022-08-11 DIAGNOSIS — J4530 Mild persistent asthma, uncomplicated: Secondary | ICD-10-CM | POA: Diagnosis not present

## 2022-08-11 DIAGNOSIS — M9906 Segmental and somatic dysfunction of lower extremity: Secondary | ICD-10-CM | POA: Diagnosis not present

## 2022-08-11 DIAGNOSIS — T63481A Toxic effect of venom of other arthropod, accidental (unintentional), initial encounter: Secondary | ICD-10-CM

## 2022-08-11 MED ORDER — AZELASTINE-FLUTICASONE 137-50 MCG/ACT NA SUSP: NASAL | 5 refills | Status: AC

## 2022-08-11 MED ORDER — PREDNISONE 10 MG PO TABS: ORAL_TABLET | ORAL | 0 refills | Status: DC

## 2022-08-11 MED ORDER — ALBUTEROL SULFATE HFA 108 (90 BASE) MCG/ACT IN AERS
INHALATION_SPRAY | RESPIRATORY_TRACT | 1 refills | Status: DC
Start: 2022-08-11 — End: 2024-03-12

## 2022-08-11 MED ORDER — EPINEPHRINE 0.3 MG/0.3ML IJ SOAJ: INTRAMUSCULAR | 1 refills | Status: AC

## 2022-08-11 MED ORDER — METHYLPREDNISOLONE ACETATE 40 MG/ML IJ SUSP
40.0000 mg | Freq: Once | INTRAMUSCULAR | Status: AC
  Administered 2022-08-11: 40 mg via INTRAMUSCULAR

## 2022-08-11 MED ORDER — BUDESONIDE-FORMOTEROL FUMARATE 160-4.5 MCG/ACT IN AERO
INHALATION_SPRAY | RESPIRATORY_TRACT | 5 refills | Status: DC
Start: 2022-08-11 — End: 2023-05-29

## 2022-08-11 NOTE — Patient Instructions (Addendum)
1. Mild persistent asthma, uncomplicated - Lung testing was lower today. - We gave you a nebulizer treatment and a steroid injection. - Start the prednisone taper that we are sending in (you can start tonight). - Use the Breyna two puffs every 4 hours consistently through the weekend (it is ok to sleep at night without doses!) and then wean as tolerated from there.  - Call us with problems! We have 24/7 physician access!  - Daily controller medication(s): NONE - Prior to physical activity: ProAir 2 puffs 10-15 minutes before physical activity. - Rescue medications: ProAir 4 puffs every 4-6 hours as needed - Changes during respiratory infections or worsening symptoms: Add on Breyna 171mg 2 puffs every 4 hours as needed for coughing/wheezing for TWO WEEKS. - Asthma control goals:  * Full participation in all desired activities (may need albuterol before activity) * Albuterol use two time or less a week on average (not counting use with activity) * Cough interfering with sleep two time or less a month * Oral steroids no more than once a year * No hospitalizations  2. Allergic rhinitis (grasses, weeds, trees, indoor molds, dust mites, cat, dog, and horse) - Continue with Xyzal '5mg'$  once daily. - Continue with Dymista as needed.     3. 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   4. Return in about 6 months (around 02/10/2023).    Please inform [place] any Emergency Department visits, hospitalizations, or changes in symptoms. Call [place] going to the ED for breathing or allergy symptoms since we might be able to fit you in for a sick visit. Feel free to contact u[place]anytime with any questions, problems, or concerns.  It was a pleasure to see you again today!  Websites that have reliable patient information: 1. American Academy of Asthma, Allergy, and Immunology: www.aaaai.org 2. Food Allergy Research and Education (FARE): foodallergy.org 3. Mothers of Asthmatics:  http://www.asthmacommunitynetwork.org 4. American College of Allergy, Asthma, and Immunology: www.acaai.org   COVID-19 Vaccine Information can be found at: [email or web address] questions related to vaccine distribution or appointments, please email [email or web address] or call [phone]     "Like" [facility] Facebook and Instagram for our latest updates!        Make sure you are registered to vote! If you have moved or changed any of your contact information, you will need to get this updated before voting!  In some cases, you MAY be able to register to vote online: [email or web address]

## 2022-08-11 NOTE — Progress Notes (Signed)
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 Mild persistent asthma, uncomplicated - Lung testing was lower today. - We gave you a nebulizer treatment and a steroid injection. - Start the prednisone taper that we are sending in (you can start tonight). - Use the Breyna two puffs every 4 hours consistently through the weekend (it is ok to sleep at night without doses!) and then wean as tolerated from there.  - Call us with problems! We have 24/7 physician access!  - Daily controller medication(s): NONE - Prior to physical activity: ProAir 2 puffs 10-15 minutes before physical activity. - Rescue medications: ProAir 4 puffs every 4-6 hours as needed - Changes during respiratory infections or worsening symptoms: Add on Breyna 153mg 2 puffs every 4 hours as needed for coughing/wheezing for TWO WEEKS. - Asthma control goals:  * Full participation in all desired activities (may need albuterol before activity) * Albuterol use two time or less a week on average (not counting use with activity) * Cough interfering with sleep two time or less a month * Oral steroids no more than once a year * No hospitalizations  2. Allergic rhinitis (grasses, weeds, trees, indoor molds, dust mites, cat, dog, and horse) - Continue with Xyzal '5mg'$  once daily. - Continue with Dymista as needed.     3. 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   4. Return in about 6 months (around 02/10/2023).    Subjective:   JKirsty Monjarazis a 51y.o. female presenting today for follow up of  Chief Complaint  Patient presents with  . Asthma    Has been sick since before thanksgiving. She does not have an  albuterol. Did 2 covid tests and had an antibiotic. Feel like there is fluid in her ears, cough.     JJanny Crutehas a history of the following: Patient Active Problem List   Diagnosis Date Noted  . 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 Other allergic rhinitis 11/20/2015  . Mild intermittent asthma without complication 042/59/5638 . History of insect sting allergy 11/20/2015    History obtained from: chart review and patient.  JTaileris a 51y.o. female presenting for a follow up visit. She was last seen in June 2023. At that time, we continued with albuterol as needed. She had Arnuity to add on during flares. For her rhinitis, we continued with levocetirizine as well as Dymista. We refilled her EpiPen for her stinging insect allergy.   Since the last visit, she has mostly done well. [place] She got sick during this visits and everything has escalated since that time. She was placed on amoxicillin and completed the entire course. She did feel a bit better with regards to her sinus issues. But the cough got a lot of wrose.   She has had a couple of asthma attacks.   Asthma/Respiratory Symptom History: She has not added the Arnuity. She was doing great during the [place].  We felt at one time that she was losing her asthma since she had done so well. She does not remember the last time that she needed prednisone for  her symptoms. She has not been in the hospital or ED for her symptoms. She has not been on a daily medication for her asthma in quite some time.   Allergic Rhinitis Symptom History: Allergic rhinitis is under good control with the levocetirizine as well as the Dymista.   {Blank single:19197::"Food Allergy Symptom History: ***"," "}  {Blank single:19197::"Skin Symptom History: ***"," "}  {Blank single:19197::"GERD Symptom History: ***"," "}  Otherwise, there have been no changes to her past medical history,  surgical history, family history, or social history.    ROS     Objective:   Blood pressure 118/88, pulse 74, temperature 98.4 F (36.9 C), resp. rate 20, height '5\' 4"'$  (1.626 m), weight 129 lb 7 oz (58.7 kg), SpO2 99 %. Body mass index is 22.22 kg/m.    Physical Exam   Diagnostic studies:    Spirometry: results abnormal (FEV1: 2.11/78%, FVC: 2.63/77%, FEV1/FVC: 80%).    Spirometry consistent with possible restrictive disease. Xopenex four puffs via MDI treatment given in clinic with {Blank single:19197::"significant improvement in FEV1 per ATS criteria","significant improvement in FVC per ATS criteria","significant improvement in FEV1 and FVC per ATS criteria","improvement in FEV1, but not significant per ATS criteria","improvement in FVC, but not significant per ATS criteria","improvement in FEV1 and FVC, but not significant per ATS criteria","no improvement"}.  Allergy Studies: {Blank single:19197::"none","labs sent instead"," "}    {Blank single:19197::"Allergy testing results were read and interpreted by myself, documented by clinical staff."," "}      Salvatore Marvel, MD  [facility]

## 2022-08-12 DIAGNOSIS — M25551 Pain in right hip: Secondary | ICD-10-CM | POA: Diagnosis not present

## 2022-08-12 DIAGNOSIS — M41126 Adolescent idiopathic scoliosis, lumbar region: Secondary | ICD-10-CM | POA: Diagnosis not present

## 2022-08-12 DIAGNOSIS — M9903 Segmental and somatic dysfunction of lumbar region: Secondary | ICD-10-CM | POA: Diagnosis not present

## 2022-08-12 DIAGNOSIS — M9902 Segmental and somatic dysfunction of thoracic region: Secondary | ICD-10-CM | POA: Diagnosis not present

## 2022-08-12 DIAGNOSIS — M9901 Segmental and somatic dysfunction of cervical region: Secondary | ICD-10-CM | POA: Diagnosis not present

## 2022-08-12 DIAGNOSIS — M9906 Segmental and somatic dysfunction of lower extremity: Secondary | ICD-10-CM | POA: Diagnosis not present

## 2022-08-14 ENCOUNTER — Encounter: Payer: Self-pay | Admitting: Allergy & Immunology

## 2022-08-15 ENCOUNTER — Encounter: Payer: Self-pay | Admitting: Gastroenterology

## 2022-08-16 DIAGNOSIS — M9901 Segmental and somatic dysfunction of cervical region: Secondary | ICD-10-CM | POA: Diagnosis not present

## 2022-08-16 DIAGNOSIS — M9902 Segmental and somatic dysfunction of thoracic region: Secondary | ICD-10-CM | POA: Diagnosis not present

## 2022-08-16 DIAGNOSIS — M41126 Adolescent idiopathic scoliosis, lumbar region: Secondary | ICD-10-CM | POA: Diagnosis not present

## 2022-08-16 DIAGNOSIS — M9903 Segmental and somatic dysfunction of lumbar region: Secondary | ICD-10-CM | POA: Diagnosis not present

## 2022-08-18 DIAGNOSIS — M9903 Segmental and somatic dysfunction of lumbar region: Secondary | ICD-10-CM | POA: Diagnosis not present

## 2022-08-18 DIAGNOSIS — M9901 Segmental and somatic dysfunction of cervical region: Secondary | ICD-10-CM | POA: Diagnosis not present

## 2022-08-18 DIAGNOSIS — M41126 Adolescent idiopathic scoliosis, lumbar region: Secondary | ICD-10-CM | POA: Diagnosis not present

## 2022-08-18 DIAGNOSIS — M9902 Segmental and somatic dysfunction of thoracic region: Secondary | ICD-10-CM | POA: Diagnosis not present

## 2022-08-23 DIAGNOSIS — M41126 Adolescent idiopathic scoliosis, lumbar region: Secondary | ICD-10-CM | POA: Diagnosis not present

## 2022-08-23 DIAGNOSIS — M9903 Segmental and somatic dysfunction of lumbar region: Secondary | ICD-10-CM | POA: Diagnosis not present

## 2022-08-23 DIAGNOSIS — M9901 Segmental and somatic dysfunction of cervical region: Secondary | ICD-10-CM | POA: Diagnosis not present

## 2022-08-23 DIAGNOSIS — M9902 Segmental and somatic dysfunction of thoracic region: Secondary | ICD-10-CM | POA: Diagnosis not present

## 2022-08-25 DIAGNOSIS — M9901 Segmental and somatic dysfunction of cervical region: Secondary | ICD-10-CM | POA: Diagnosis not present

## 2022-08-25 DIAGNOSIS — M9902 Segmental and somatic dysfunction of thoracic region: Secondary | ICD-10-CM | POA: Diagnosis not present

## 2022-08-25 DIAGNOSIS — M9903 Segmental and somatic dysfunction of lumbar region: Secondary | ICD-10-CM | POA: Diagnosis not present

## 2022-08-25 DIAGNOSIS — M41126 Adolescent idiopathic scoliosis, lumbar region: Secondary | ICD-10-CM | POA: Diagnosis not present

## 2022-09-09 DIAGNOSIS — M41126 Adolescent idiopathic scoliosis, lumbar region: Secondary | ICD-10-CM | POA: Diagnosis not present

## 2022-09-09 DIAGNOSIS — M9902 Segmental and somatic dysfunction of thoracic region: Secondary | ICD-10-CM | POA: Diagnosis not present

## 2022-09-09 DIAGNOSIS — M9903 Segmental and somatic dysfunction of lumbar region: Secondary | ICD-10-CM | POA: Diagnosis not present

## 2022-09-09 DIAGNOSIS — M9901 Segmental and somatic dysfunction of cervical region: Secondary | ICD-10-CM | POA: Diagnosis not present

## 2022-09-13 DIAGNOSIS — M9901 Segmental and somatic dysfunction of cervical region: Secondary | ICD-10-CM | POA: Diagnosis not present

## 2022-09-13 DIAGNOSIS — M25551 Pain in right hip: Secondary | ICD-10-CM | POA: Diagnosis not present

## 2022-09-13 DIAGNOSIS — M9902 Segmental and somatic dysfunction of thoracic region: Secondary | ICD-10-CM | POA: Diagnosis not present

## 2022-09-15 DIAGNOSIS — M25551 Pain in right hip: Secondary | ICD-10-CM | POA: Diagnosis not present

## 2022-09-15 DIAGNOSIS — M9902 Segmental and somatic dysfunction of thoracic region: Secondary | ICD-10-CM | POA: Diagnosis not present

## 2022-09-15 DIAGNOSIS — M9901 Segmental and somatic dysfunction of cervical region: Secondary | ICD-10-CM | POA: Diagnosis not present

## 2022-09-20 DIAGNOSIS — M9902 Segmental and somatic dysfunction of thoracic region: Secondary | ICD-10-CM | POA: Diagnosis not present

## 2022-09-20 DIAGNOSIS — M41126 Adolescent idiopathic scoliosis, lumbar region: Secondary | ICD-10-CM | POA: Diagnosis not present

## 2022-09-20 DIAGNOSIS — M9901 Segmental and somatic dysfunction of cervical region: Secondary | ICD-10-CM | POA: Diagnosis not present

## 2022-09-20 DIAGNOSIS — M9903 Segmental and somatic dysfunction of lumbar region: Secondary | ICD-10-CM | POA: Diagnosis not present

## 2022-09-22 DIAGNOSIS — M9903 Segmental and somatic dysfunction of lumbar region: Secondary | ICD-10-CM | POA: Diagnosis not present

## 2022-09-22 DIAGNOSIS — M41126 Adolescent idiopathic scoliosis, lumbar region: Secondary | ICD-10-CM | POA: Diagnosis not present

## 2022-09-22 DIAGNOSIS — M9901 Segmental and somatic dysfunction of cervical region: Secondary | ICD-10-CM | POA: Diagnosis not present

## 2022-09-22 DIAGNOSIS — M9902 Segmental and somatic dysfunction of thoracic region: Secondary | ICD-10-CM | POA: Diagnosis not present

## 2022-10-10 DIAGNOSIS — M41126 Adolescent idiopathic scoliosis, lumbar region: Secondary | ICD-10-CM | POA: Diagnosis not present

## 2022-10-10 DIAGNOSIS — M25551 Pain in right hip: Secondary | ICD-10-CM | POA: Diagnosis not present

## 2022-10-10 DIAGNOSIS — M9906 Segmental and somatic dysfunction of lower extremity: Secondary | ICD-10-CM | POA: Diagnosis not present

## 2022-10-14 DIAGNOSIS — M9901 Segmental and somatic dysfunction of cervical region: Secondary | ICD-10-CM | POA: Diagnosis not present

## 2022-10-14 DIAGNOSIS — M9902 Segmental and somatic dysfunction of thoracic region: Secondary | ICD-10-CM | POA: Diagnosis not present

## 2022-10-19 ENCOUNTER — Other Ambulatory Visit: Payer: Self-pay | Admitting: Gastroenterology

## 2022-10-19 MED ORDER — LINACLOTIDE 72 MCG PO CAPS: 72.0000 ug | ORAL_CAPSULE | Freq: Every day | ORAL | 0 refills | Status: DC

## 2022-10-24 DIAGNOSIS — Z6822 Body mass index (BMI) 22.0-22.9, adult: Secondary | ICD-10-CM | POA: Diagnosis not present

## 2022-10-24 DIAGNOSIS — Z1329 Encounter for screening for other suspected endocrine disorder: Secondary | ICD-10-CM | POA: Diagnosis not present

## 2022-10-24 DIAGNOSIS — Z1231 Encounter for screening mammogram for malignant neoplasm of breast: Secondary | ICD-10-CM | POA: Diagnosis not present

## 2022-10-24 DIAGNOSIS — R438 Other disturbances of smell and taste: Secondary | ICD-10-CM | POA: Diagnosis not present

## 2022-10-24 DIAGNOSIS — Z1272 Encounter for screening for malignant neoplasm of vagina: Secondary | ICD-10-CM | POA: Diagnosis not present

## 2022-10-24 DIAGNOSIS — Z01419 Encounter for gynecological examination (general) (routine) without abnormal findings: Secondary | ICD-10-CM | POA: Diagnosis not present

## 2022-10-24 DIAGNOSIS — E559 Vitamin D deficiency, unspecified: Secondary | ICD-10-CM | POA: Diagnosis not present

## 2022-10-27 ENCOUNTER — Other Ambulatory Visit: Payer: Self-pay | Admitting: Obstetrics and Gynecology

## 2022-10-27 DIAGNOSIS — R928 Other abnormal and inconclusive findings on diagnostic imaging of breast: Secondary | ICD-10-CM

## 2022-11-09 DIAGNOSIS — Z13820 Encounter for screening for osteoporosis: Secondary | ICD-10-CM | POA: Diagnosis not present

## 2022-11-10 ENCOUNTER — Ambulatory Visit
Admission: RE | Admit: 2022-11-10 | Discharge: 2022-11-10 | Disposition: A | Discharge status: Home / Self Care | Payer: BC Managed Care – PPO | Source: Ambulatory Visit | Attending: Obstetrics and Gynecology | Admitting: Obstetrics and Gynecology

## 2022-11-10 DIAGNOSIS — R928 Other abnormal and inconclusive findings on diagnostic imaging of breast: Secondary | ICD-10-CM

## 2022-11-10 DIAGNOSIS — R922 Inconclusive mammogram: Secondary | ICD-10-CM | POA: Diagnosis not present

## 2022-11-10 DIAGNOSIS — N6001 Solitary cyst of right breast: Secondary | ICD-10-CM | POA: Diagnosis not present

## 2022-11-17 ENCOUNTER — Ambulatory Visit (INDEPENDENT_AMBULATORY_CARE_PROVIDER_SITE_OTHER): Payer: BC Managed Care – PPO | Admitting: Family Medicine

## 2022-11-17 ENCOUNTER — Other Ambulatory Visit: Payer: Self-pay

## 2022-11-17 ENCOUNTER — Encounter: Payer: Self-pay | Admitting: Family Medicine

## 2022-11-17 VITALS — BP 126/80 | HR 84 | Temp 98.1°F | Resp 12 | Wt 127.6 lb

## 2022-11-17 DIAGNOSIS — J3089 Other allergic rhinitis: Secondary | ICD-10-CM

## 2022-11-17 DIAGNOSIS — T783XXA Angioneurotic edema, initial encounter: Secondary | ICD-10-CM | POA: Diagnosis not present

## 2022-11-17 DIAGNOSIS — J4531 Mild persistent asthma with (acute) exacerbation: Secondary | ICD-10-CM | POA: Insufficient documentation

## 2022-11-17 DIAGNOSIS — T63481A Toxic effect of venom of other arthropod, accidental (unintentional), initial encounter: Secondary | ICD-10-CM

## 2022-11-17 DIAGNOSIS — T63481D Toxic effect of venom of other arthropod, accidental (unintentional), subsequent encounter: Secondary | ICD-10-CM | POA: Diagnosis not present

## 2022-11-17 DIAGNOSIS — J4530 Mild persistent asthma, uncomplicated: Secondary | ICD-10-CM | POA: Diagnosis not present

## 2022-11-17 DIAGNOSIS — J302 Other seasonal allergic rhinitis: Secondary | ICD-10-CM

## 2022-11-17 MED ORDER — LEVOCETIRIZINE DIHYDROCHLORIDE 5 MG PO TABS: ORAL_TABLET | ORAL | 5 refills | Status: DC

## 2022-11-17 MED ORDER — PREDNISONE 10 MG PO TABS: ORAL_TABLET | ORAL | 0 refills | Status: DC

## 2022-11-17 NOTE — Patient Instructions (Addendum)
Eye redness/angioedema Immediately contact your ophthalmologist with any change in vision or eye pain Prednisone 10 mg tablets. Take 2 tablets once a day for 4 days, then take 1 tablet on the 5th day, then stop.   Asthma Continue albuterol 2 puffs every 4 hours as needed for cough or wheeze OR Instead use albuterol 0.083% solution via nebulizer one unit vial every 4 hours as needed for cough or wheeze Continue Breyna 2 puffs as needed for cough or wheeze  Allergic rhinitis Continue allergen avoidance measures directed toward grass pollen, weed pollen, tree pollen, mold, dust mite, cat, dog, and horse as listed below Begin a daily antihistamine.Remember to rotate to a different antihistamine about every 3 months. Some examples of over the counter antihistamines include Zyrtec (cetirizine), Xyzal (levocetirizine), Allegra (fexofenadine), and Claritin (loratidine).  Begin Ryaltris 2 sprays in each nostril once or twice a day as needed for nasal symptoms Consider saline nasal rinses as needed for nasal symptoms. Use this before any medicated nasal sprays for best result Consider allergen immunotherapy if your symptoms are not well-controlled with the treatment plan as listed above  Allergic conjunctivitis Some over the counter eye drops include Pataday one drop in each eye once a day as needed for red, itchy eyes OR Zaditor one drop in each eye twice a day as needed for red itchy eyes. Avoid eye drops that say red eye relief as they may contain medications that dry out your eyes.   Call the clinic if this treatment plan is not working well for you.  Follow up in 3 months or sooner if needed.    Reducing Pollen Exposure The American Academy of Allergy, Asthma and Immunology suggests the following steps to reduce your exposure to pollen during allergy seasons. Do not hang sheets or clothing out to dry; pollen may collect on these items. Do not mow lawns or spend time around freshly cut grass;  mowing stirs up pollen. Keep windows closed at night.  Keep car windows closed while driving. Minimize morning activities outdoors, a time when pollen counts are usually at their highest. Stay indoors as much as possible when pollen counts or humidity is high and on windy days when pollen tends to remain in the air longer. Use air conditioning when possible.  Many air conditioners have filters that trap the pollen spores. Use a HEPA room air filter to remove pollen form the indoor air you breathe.  Control of Mold Allergen Mold and fungi can grow on a variety of surfaces provided certain temperature and moisture conditions exist.  Outdoor molds grow on plants, decaying vegetation and soil.  The major outdoor mold, Alternaria and Cladosporium, are found in very high numbers during hot and dry conditions.  Generally, a late Summer - Fall peak is seen for common outdoor fungal spores.  Rain will temporarily lower outdoor mold spore count, but counts rise rapidly when the rainy period ends.  The most important indoor molds are Aspergillus and Penicillium.  Dark, humid and poorly ventilated basements are ideal sites for mold growth.  The next most common sites of mold growth are the bathroom and the kitchen.  Outdoor [employer] Use air conditioning and keep windows closed Avoid exposure to decaying vegetation. Avoid leaf raking. Avoid grain handling. Consider wearing a face mask if working in moldy areas.  Indoor Mold Control Maintain humidity below 50%. Clean washable surfaces with 5% bleach solution. Remove sources e.g. Contaminated carpets.   Control of Dust Mite Allergen Dust mites  play a major role in allergic asthma and rhinitis. They occur in environments with high humidity wherever human skin is found. Dust mites absorb humidity from the atmosphere (ie, they do not drink) and feed on organic matter (including shed human and animal skin). Dust mites are a microscopic type of insect that  you cannot see with the naked eye. High levels of dust mites have been detected from mattresses, pillows, carpets, upholstered furniture, bed covers, clothes, soft toys and any woven material. The principal allergen of the dust mite is found in its feces. A gram of dust may contain 1,000 mites and 250,000 fecal particles. Mite antigen is easily measured in the air during house cleaning activities. Dust mites do not bite and do not cause harm to humans, other than by triggering allergies/asthma.  Ways to decrease your exposure to dust mites in your home:  1. Encase mattresses, box springs and pillows with a mite-impermeable barrier or cover  2. Wash sheets, blankets and drapes weekly in hot water (130 F) with detergent and dry them in a dryer on the hot setting.  3. Have the room cleaned frequently with a vacuum cleaner and a damp dust-mop. For carpeting or rugs, vacuuming with a vacuum cleaner equipped with a high-efficiency particulate air (HEPA) filter. The dust mite allergic individual should not be in a room which is being cleaned and should wait 1 hour after cleaning before going into the room.  4. Do not sleep on upholstered furniture (eg, couches).  5. If possible removing carpeting, upholstered furniture and drapery from the home is ideal. Horizontal blinds should be eliminated in the rooms where the person spends the most time (bedroom, study, television room). Washable vinyl, roller-type shades are optimal.  6. Remove all non-washable stuffed toys from the bedroom. Wash stuffed toys weekly like sheets and blankets above.  7. Reduce indoor humidity to less than 50%. Inexpensive humidity monitors can be purchased at most hardware stores. Do not use a humidifier as can make the problem worse and are not recommended.  Control of Dog or Cat Allergen Avoidance is the best way to manage a dog or cat allergy. If you have a dog or cat and are allergic to dog or cats, consider removing the dog or  cat from the home. If you have a dog or cat but don't want to find it a new home, or if your family wants a pet even though someone in the household is allergic, here are some strategies that may help keep symptoms at bay:  Keep the pet out of your bedroom and restrict it to only a few rooms. Be advised that keeping the dog or cat in only one room will not limit the allergens to that room. Don't pet, hug or kiss the dog or cat; if you do, wash your hands with soap and water. High-efficiency particulate air (HEPA) cleaners run continuously in a bedroom or living room can reduce allergen levels over time. Regular use of a high-efficiency vacuum cleaner or a central vacuum can reduce allergen levels. Giving your dog or cat a bath at least once a week can reduce airborne allergen.

## 2022-11-17 NOTE — Progress Notes (Signed)
[place] [address] Dept: [phone]  FOLLOW UP NOTE  Patient ID: Cheryl Mata, female    DOB: 05/16/1971  Age: 51 y.o. MRN: [id number] Date of Office Visit: 11/17/2022  Assessment  Chief Complaint: Allergic Reaction (Walked 2 miles at lunch time. Corner of eye itched ang got swollen then eyeball swelled shut, ears itching, throat itching. Took Benadryl. Swelling gone down but eyes are still swollen and hard to see out of. Uses hot compresses as well)  HPI Cheryl Mata is a 53 year old female who presents to the clinic for evaluation of eye swelling.  She was last seen in this clinic on 08/11/2022 by Dr. Ernst Bowler for evaluation of asthma with exacerbation requiring prednisone for resolution.  At today's visit, she reports that she has been experiencing eye swelling that began yesterday while she was taking a walk at lunchtime.  She reports that she reached up to scratch her right eye and shortly after that began to experience eye swelling she, redness, clear watery drainage as well as nasal symptoms including clear rhinorrhea, nasal congestion, and postnasal drainage.  She reports that yesterday she did have some blurred vision and her right eye was slightly sensitive to light.  She reports that when she woke up this morning she had clear thick matting in her right eye and some in the left eye, however, she believes that the matting from the right eye and dripped into the left eye while sleeping on her side.  She has not developed any further discharge from either eye.  At today's visit she reports a decrease in blurred vision and denies eye pain.  She does have a regular ophthalmologist and reports that she will contact their office with any change in vision or eye pain.  She does report that over the last 2 weeks she has had a red, itchy, dry, peeling rash underneath both eyes.  She continues Xyzal as needed and is not currently using any nasal sprays.  She does report that  she has not taken Xyzal for quite some time.  She reports that she has not been outside much this season.  Her last environmental allergy testing was greater than 5 years ago and was positive to grass pollen, weed pollen, tree pollen, mold, dust mite, cat, dog, and horse.  She does have dust mite covers in use on her mattress and pillows and also reports that she has 4 cats at this time 1 of which sleeps in her bedroom.  Asthma is reported as well-controlled with no shortness of breath, cough, or wheeze.  She is not currently using Breyna.  She reports her last insect sting occurred 2 years ago with large local swelling.  She denies cardiopulmonary or gastrointestinal symptoms at that time.  EpiPen's are up-to-date.  Her current medications are listed in the chart.  Drug Allergies:  Allergies  Allergen Reactions   Yellow Jacket Venom [Bee Venom] Anaphylaxis    Physical Exam: BP 126/80   Pulse 84   Temp 98.1 F (36.7 C) (Temporal)   Resp 12   Wt 127 lb 9.6 oz (57.9 kg)   SpO2 98%   BMI 21.90 kg/m    Physical Exam Vitals reviewed.  Constitutional:      Appearance: Normal appearance.  HENT:     Head: Normocephalic and atraumatic.     Right Ear: Tympanic membrane normal.     Left Ear: Tympanic membrane normal.     Nose:  Comments: Bilateral nares slightly erythematous with clear nasal drainage noted.  Pharynx normal.  Ears normal.  Eyes normal.    Mouth/Throat:     Pharynx: Oropharynx is clear.  Eyes:     Comments: Conjunctiva right eye slightly injected.  Cardiovascular:     Rate and Rhythm: Normal rate and regular rhythm.     Heart sounds: Normal heart sounds. No murmur heard. Pulmonary:     Effort: Pulmonary effort is normal.     Breath sounds: Normal breath sounds.     Comments: Lungs clear to auscultation Musculoskeletal:        General: Normal range of motion.     Cervical back: Normal range of motion and neck supple.  Skin:    General: Skin is warm.      Comments: Slight erythema and edema noted on bilateral upper eyelids  Neurological:     Mental Status: She is alert and oriented to person, place, and time.  Psychiatric:        Mood and Affect: Mood normal.        Behavior: Behavior normal.        Thought Content: Thought content normal.        Judgment: Judgment normal.    Assessment and Plan: 1. Angioedema, initial encounter   2. Mild persistent asthma with acute exacerbation   3. 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 Seasonal and perennial allergic rhinitis     Meds ordered this encounter  Medications   predniSONE (DELTASONE) 10 MG tablet    Sig: Prednisone 10 mg tablets. Take 2 tablets once a day for 4 days, then take 1 tablet on the 5th day, then stop.    Dispense:  9 tablet    Refill:  0    Patient Instructions  Eye redness/angioedema Immediately contact your ophthalmologist with any change in vision or eye pain Prednisone 10 mg tablets. Take 2 tablets once a day for 4 days, then take 1 tablet on the 5th day, then stop.   Asthma Continue albuterol 2 puffs every 4 hours as needed for cough or wheeze OR Instead use albuterol 0.083% solution via nebulizer one unit vial every 4 hours as needed for cough or wheeze Continue Breyna 2 puffs as needed for cough or wheeze  Allergic rhinitis Continue allergen avoidance measures directed toward grass pollen, weed pollen, tree pollen, mold, dust mite, cat, dog, and horse as listed below Begin a daily antihistamine.Remember to rotate to a different antihistamine about every 3 months. Some examples of over the counter antihistamines include Zyrtec (cetirizine), Xyzal (levocetirizine), Allegra (fexofenadine), and Claritin (loratidine).  Begin Ryaltris 2 sprays in each nostril once or twice a day as needed for nasal symptoms Consider saline nasal rinses as needed for nasal symptoms. Use this before any medicated nasal sprays for best result Consider allergen immunotherapy if your symptoms  are not well-controlled with the treatment plan as listed above  Allergic conjunctivitis Some over the counter eye drops include Pataday one drop in each eye once a day as needed for red, itchy eyes OR Zaditor one drop in each eye twice a day as needed for red itchy eyes. Avoid eye drops that say red eye relief as they may contain medications that dry out your eyes.   Call the clinic if this treatment plan is not working well for you.  Follow up in 3 months or sooner if needed.   Return in about 3 months (around 02/17/2023), or if symptoms worsen or  fail to improve.    Thank you for the opportunity to care for this patient.  Please do not hesitate to contact me with questions.  Gareth Hoey, FNP [facility] [place]

## 2022-11-17 NOTE — Addendum Note (Signed)
Addended by: Eloy End D on: 11/17/2022 04:28 PM   Modules accepted: Orders

## 2023-02-09 DIAGNOSIS — L821 Other seborrheic keratosis: Secondary | ICD-10-CM | POA: Diagnosis not present

## 2023-02-09 DIAGNOSIS — L814 Other melanin hyperpigmentation: Secondary | ICD-10-CM | POA: Diagnosis not present

## 2023-02-14 ENCOUNTER — Encounter: Payer: Self-pay | Admitting: Allergy & Immunology

## 2023-02-14 ENCOUNTER — Other Ambulatory Visit: Payer: Self-pay

## 2023-02-14 ENCOUNTER — Ambulatory Visit (INDEPENDENT_AMBULATORY_CARE_PROVIDER_SITE_OTHER): Payer: BC Managed Care – PPO | Admitting: Allergy & Immunology

## 2023-02-14 VITALS — BP 110/84 | HR 74 | Temp 98.3°F | Resp 16 | Ht 63.5 in | Wt 131.0 lb

## 2023-02-14 DIAGNOSIS — T63481D Toxic effect of venom of other arthropod, accidental (unintentional), subsequent encounter: Secondary | ICD-10-CM

## 2023-02-14 DIAGNOSIS — J302 Other seasonal allergic rhinitis: Secondary | ICD-10-CM

## 2023-02-14 DIAGNOSIS — J3089 Other allergic rhinitis: Secondary | ICD-10-CM

## 2023-02-14 DIAGNOSIS — T783XXD Angioneurotic edema, subsequent encounter: Secondary | ICD-10-CM | POA: Diagnosis not present

## 2023-02-14 DIAGNOSIS — T783XXA Angioneurotic edema, initial encounter: Secondary | ICD-10-CM

## 2023-02-14 DIAGNOSIS — J4520 Mild intermittent asthma, uncomplicated: Secondary | ICD-10-CM | POA: Diagnosis not present

## 2023-02-14 DIAGNOSIS — T63481A Toxic effect of venom of other arthropod, accidental (unintentional), initial encounter: Secondary | ICD-10-CM

## 2023-02-14 NOTE — Patient Instructions (Addendum)
1. Mild persistent asthma, uncomplicated - Lung testing not done today.  - Daily controller medication(s): NONE - Prior to physical activity: ProAir 2 puffs 10-15 minutes before physical activity. - Rescue medications: ProAir 4 puffs every 4-6 hours as needed - Changes during respiratory infections or worsening symptoms: Add on Breyna 2 puffs every 4 hours as needed for coughing/wheezing for TWO WEEKS. - Asthma control goals:  * Full participation in all desired activities (may need albuterol before activity) * Albuterol use two time or less a week on average (not counting use with activity) * Cough interfering with sleep two time or less a month * Oral steroids no more than once a year * No hospitalizations  2. Allergic rhinitis (grasses, weeds, trees, indoor molds, dust mites, cat, dog, and horse) - Continue with Xyzal 5mg  once daily. - Continue with Dymista as needed.     3. 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   4. Return in about 6 months (around 08/16/2023).    Please inform us of any Emergency Department visits, hospitalizations, or changes in symptoms. Call us before going to the ED for breathing or allergy symptoms since we might be able to fit you in for a sick visit. Feel free to contact us anytime with any questions, problems, or concerns.  It was a pleasure to see you again today!  Websites that have reliable patient information: 1. American Academy of Asthma, Allergy, and Immunology: www.aaaai.org 2. Food Allergy Research and Education (FARE): foodallergy.org 3. Mothers of Asthmatics: http://www.asthmacommunitynetwork.org 4. American College of Allergy, Asthma, and Immunology: www.acaai.org   COVID-19 Vaccine Information can be found at: [email or web address].[email or web address] For questions related to vaccine distribution or appointments, please email [email or web address] or call [phone].      "Like" [place] on Facebook and Instagram for our latest updates!        Make sure you are registered to vote! If you have moved or changed any of your contact information, you will need to get this updated before voting!  In some cases, you MAY be able to register to vote online: [email or web address]

## 2023-02-14 NOTE — Progress Notes (Signed)
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 Mild persistent asthma, uncomplicated - Lung testing not done today.  - Daily controller medication(s): NONE - Prior to physical activity: ProAir 2 puffs 10-15 minutes before physical activity. - Rescue medications: ProAir 4 puffs every 4-6 hours as needed - Changes during respiratory infections or worsening symptoms: Add on Breyna 2 puffs every 4 hours as needed for coughing/wheezing for TWO WEEKS. - Asthma control goals:  * Full participation in all desired activities (may need albuterol before activity) * Albuterol use two time or less a week on average (not counting use with activity) * Cough interfering with sleep two time or less a month * Oral steroids no more than once a year * No hospitalizations  2. Allergic rhinitis (grasses, weeds, trees, indoor molds, dust mites, cat, dog, and horse) - Continue with Xyzal 5mg  once daily. - Continue with Dymista as needed.     3. 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   4. Return in about 6 months (around 08/16/2023).    Subjective:   Cheryl Mata is a 52 y.o. 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  Cheryl Mata is a 52 y.o. female presenting for a follow up visit. She was last seen in March 2024 for evaluation of angioedema.  At that time, she was started on prednisone for her breathing issues and continued on Augmentin.   She was walking around her building and she had some swelling around her eye. She had swollen eyes. This was the whole corner of eye ball. She had some film over her eyes. Prednisone helped when she got this from [place]. She did not see anything bite her at all. She did feel a little bit of sensation of throat swelling.   Asthma/Respiratory Symptom History: She remains on the Breyna two puffs twice daily during flares. She used this for two weeks.  She is generally doing well. She did have to move her daughter several times. She is now in [place]. Wedding went well, although they are not getting a divorce. Her ex husband had what they think is a psychotic break. They had dated for over two years and no red flags came up.    Allergic Rhinitis Symptom History: She remains on the levocetirizine 5mg  daily as needed. She remains on the Dymista only as needed.  In general, her symptoms have continued to become less severe. However, this past spring was particularly terrible for her.   Work is going well. She has over ten years left before she can retire.   Otherwise, there have been no changes  to her past medical history, surgical history, family history, or social history.    Review of Systems  Constitutional: Negative.  Negative for chills, fever, malaise/fatigue and weight loss.  HENT: Negative.  Negative for congestion, ear discharge, ear pain and sinus pain.   Eyes:  Negative for pain, discharge and redness.  Respiratory:  Negative for cough, sputum production, shortness of breath and wheezing.   Cardiovascular: Negative.  Negative for chest pain and palpitations.   Gastrointestinal:  Negative for abdominal pain, constipation, diarrhea, heartburn, nausea and vomiting.  Skin: Negative.  Negative for itching and rash.  Neurological:  Negative for dizziness and headaches.  Endo/Heme/Allergies:  Negative for environmental allergies. Does not bruise/bleed easily.       Objective:   Blood pressure 110/84, pulse 74, temperature 98.3 F (36.8 C), resp. rate 16, height 5' 3.5" (1.613 m), weight 131 lb (59.4 kg), SpO2 99 %. Body mass index is 22.84 kg/m.    Physical Exam Vitals reviewed.  Constitutional:      Appearance: She is well-developed.     Comments: Pleasant.   HENT:     Head: Normocephalic and atraumatic.     Right Ear: Tympanic membrane, ear canal and external ear normal.     Left Ear: Tympanic membrane, ear canal and external ear normal.     Nose: No nasal deformity, septal deviation, mucosal edema or rhinorrhea.     Right Turbinates: Enlarged, swollen and pale.     Left Turbinates: Enlarged, swollen and pale.     Right Sinus: No maxillary sinus tenderness or frontal sinus tenderness.     Left Sinus: No maxillary sinus tenderness or frontal sinus tenderness.     Comments: No nasal polyps noted.    Mouth/Throat:     Lips: Pink.     Mouth: Mucous membranes are moist. Mucous membranes are not pale and not dry.     Pharynx: Uvula midline.     Comments: Cobblestoning present in the posterior oropharynx.  Eyes:     General:        Right eye: No discharge.        Left eye: No discharge.     Conjunctiva/sclera: Conjunctivae normal.     Right eye: Right conjunctiva is not injected. No chemosis.    Left eye: Left conjunctiva is not injected. No chemosis.    Pupils: Pupils are equal, round, and reactive to light.  Cardiovascular:     Rate and Rhythm: Normal rate and regular rhythm.     Heart sounds: Normal heart sounds.  Pulmonary:     Effort: Pulmonary effort is normal. No tachypnea, accessory muscle usage or respiratory distress.      Breath sounds: No decreased breath sounds, wheezing, rhonchi or rales.  Chest:     Chest wall: No tenderness.  Lymphadenopathy:     Cervical: No cervical adenopathy.  Skin:    Coloration: Skin is not pale.     Findings: No abrasion, erythema, petechiae or rash. Rash is not papular, urticarial or vesicular.  Neurological:     Mental Status: She is alert.  Psychiatric:        Behavior: Behavior is cooperative.      Diagnostic studies: none       Malachi Bonds, MD  [facility]

## 2023-04-27 ENCOUNTER — Telehealth: Payer: Self-pay | Admitting: Gastroenterology

## 2023-04-27 MED ORDER — LINACLOTIDE 72 MCG PO CAPS: 72.0000 ug | ORAL_CAPSULE | Freq: Every day | ORAL | 0 refills | Status: DC

## 2023-04-27 NOTE — Telephone Encounter (Signed)
Linzess sent to [facility]. Patient is notified.

## 2023-04-27 NOTE — Telephone Encounter (Signed)
PT needs a refill for Linzess sent to [facility] in [place]

## 2023-05-11 ENCOUNTER — Other Ambulatory Visit: Payer: Self-pay | Admitting: Family Medicine

## 2023-05-24 DIAGNOSIS — Z03818 Encounter for observation for suspected exposure to other biological agents ruled out: Secondary | ICD-10-CM | POA: Diagnosis not present

## 2023-05-24 DIAGNOSIS — R051 Acute cough: Secondary | ICD-10-CM | POA: Diagnosis not present

## 2023-05-24 DIAGNOSIS — J019 Acute sinusitis, unspecified: Secondary | ICD-10-CM | POA: Diagnosis not present

## 2023-05-24 DIAGNOSIS — J45901 Unspecified asthma with (acute) exacerbation: Secondary | ICD-10-CM | POA: Diagnosis not present

## 2023-05-29 ENCOUNTER — Encounter: Payer: Self-pay | Admitting: Family Medicine

## 2023-05-29 ENCOUNTER — Other Ambulatory Visit: Payer: Self-pay

## 2023-05-29 ENCOUNTER — Ambulatory Visit (INDEPENDENT_AMBULATORY_CARE_PROVIDER_SITE_OTHER): Payer: BC Managed Care – PPO | Admitting: Family Medicine

## 2023-05-29 VITALS — BP 120/70 | HR 98 | Temp 97.9°F | Resp 16 | Wt 124.2 lb

## 2023-05-29 DIAGNOSIS — J302 Other seasonal allergic rhinitis: Secondary | ICD-10-CM

## 2023-05-29 DIAGNOSIS — J3089 Other allergic rhinitis: Secondary | ICD-10-CM | POA: Diagnosis not present

## 2023-05-29 DIAGNOSIS — R051 Acute cough: Secondary | ICD-10-CM | POA: Insufficient documentation

## 2023-05-29 DIAGNOSIS — T63481A Toxic effect of venom of other arthropod, accidental (unintentional), initial encounter: Secondary | ICD-10-CM | POA: Diagnosis not present

## 2023-05-29 DIAGNOSIS — J4531 Mild persistent asthma with (acute) exacerbation: Secondary | ICD-10-CM | POA: Diagnosis not present

## 2023-05-29 DIAGNOSIS — Z91038 Other insect allergy status: Secondary | ICD-10-CM

## 2023-05-29 DIAGNOSIS — J45998 Other asthma: Secondary | ICD-10-CM | POA: Diagnosis not present

## 2023-05-29 MED ORDER — ALBUTEROL SULFATE (2.5 MG/3ML) 0.083% IN NEBU: 2.5000 mg | INHALATION_SOLUTION | RESPIRATORY_TRACT | 1 refills | Status: DC | PRN

## 2023-05-29 MED ORDER — FAMOTIDINE 20 MG PO TABS: ORAL_TABLET | ORAL | 1 refills | Status: DC

## 2023-05-29 MED ORDER — BUDESONIDE 0.5 MG/2ML IN SUSP: RESPIRATORY_TRACT | 1 refills | Status: DC

## 2023-05-29 MED ORDER — BENZONATATE 200 MG PO CAPS: 200.0000 mg | ORAL_CAPSULE | Freq: Three times a day (TID) | ORAL | 0 refills | Status: DC | PRN

## 2023-05-29 NOTE — Patient Instructions (Addendum)
Asthma Begin budesonide 0.5 mg twice a day via nebulizer for the next 2 weeks or until you are cough and wheeze free. Pre-treat with albuterol  Continue albuterol 2 puffs every 4 hours as needed for cough or wheeze OR instead use albuterol 0.083% solution via nebulizer one unit vial every 4 hours as needed for cough or wheeze  Allergic rhinitis Continue allergen avoidance measures directed toward grass pollen, weed pollen, tree pollen, mold, dust mite, cat, dog, and reports as listed below Begin Flonase or Nasacort 2 sprays in each nostril once a day for nasal congestion Consider saline nasal rinses as needed for nasal symptoms. Use this before any medicated nasal sprays for best result For thick postnasal drainage, begin Mucinex 600 to 1200 mg twice a day and increase fluid intake as tolerated Consider allergen immunotherapy if your symptoms are not well-controlled with the treatment plan as listed above  Reflux Continue dietary lifestyle modifications as listed below Begin famotidine 20 mg once a day for the next 1 to 2 weeks and then you may use famotidine as needed for control of reflux  Cough Continue the treatment plan as listed above for asthma, allergic rhinitis, and reflux. Begin Tessalon Perles 200 mg once every 3 hours as needed for cough If your cough worsens or if you develop a fever call the clinic and we will move forward with chest x-ray  Stinging insect allergy Continue to avoid stinging insects and have access to an epinephrine autoinjector set  Call the clinic if this treatment plan is not working well for you.  Follow up in 2 weeks or sooner if needed.  Reducing Pollen Exposure The American Academy of All[employer], Ast[employer] and Immun[employer] suggests the following steps to reduce your exposure to pollen during allergy seasons. Do not hang sheets or clothing out to dry; pollen may collect on these items. Do not mow lawns or spend time around freshly cut grass; mowing stirs up  pollen. Keep windows closed at night.  Keep car windows closed while driving. Minimize morning activities outdoors, a time when pollen counts are usually at their highest. Stay indoors as much as possible when pollen counts or humidity is high and on windy days when pollen tends to remain in the air longer. Use air conditioning when possible.  Many air conditioners have filters that trap the pollen spores. Use a HEPA room air filter to remove pollen form the indoor air you breathe.  Control of Mold Allergen Mold and fungi can grow on a variety of surfaces provided certain temperature and moisture conditions exist.  Outdoor molds grow on plants, decaying vegetation and soil.  The major outdoor mold, Alternaria and Cladosporium, are found in very high numbers during hot and dry conditions.  Generally, a late Summer - Fall peak is seen for common outdoor fungal spores.  Rain will temporarily lower outdoor mold spore count, but counts rise rapidly when the rainy period ends.  The most important indoor molds are Aspergillus and Penicillium.  Dark, humid and poorly ventilated basements are ideal sites for mold growth.  The next most common sites of mold growth are the bathroom and the kitchen.  Outdoor [employer] Use air conditioning and keep windows closed Avoid exposure to decaying vegetation. Avoid leaf raking. Avoid grain handling. Consider wearing a face mask if working in moldy areas.  Indoor Mold Control Maintain humidity below 50%. Clean washable surfaces with 5% bleach solution. Remove sources e.g. Contaminated carpets.   Control of Dust Mite Allergen Dust  mites play a major role in allergic asthma and rhinitis. They occur in environments with high humidity wherever human skin is found. Dust mites absorb humidity from the atmosphere (ie, they do not drink) and feed on organic matter (including shed human and animal skin). Dust mites are a microscopic type of insect that you cannot see  with the naked eye. High levels of dust mites have been detected from mattresses, pillows, carpets, upholstered furniture, bed covers, clothes, soft toys and any woven material. The principal allergen of the dust mite is found in its feces. A gram of dust may contain 1,000 mites and 250,000 fecal particles. Mite antigen is easily measured in the air during house cleaning activities. Dust mites do not bite and do not cause harm to humans, other than by triggering allergies/asthma.  Ways to decrease your exposure to dust mites in your home:  1. Encase mattresses, box springs and pillows with a mite-impermeable barrier or cover  2. Wash sheets, blankets and drapes weekly in hot water (130 F) with detergent and dry them in a dryer on the hot setting.  3. Have the room cleaned frequently with a vacuum cleaner and a damp dust-mop. For carpeting or rugs, vacuuming with a vacuum cleaner equipped with a high-efficiency particulate air (HEPA) filter. The dust mite allergic individual should not be in a room which is being cleaned and should wait 1 hour after cleaning before going into the room.  4. Do not sleep on upholstered furniture (eg, couches).  5. If possible removing carpeting, upholstered furniture and drapery from the home is ideal. Horizontal blinds should be eliminated in the rooms where the person spends the most time (bedroom, study, television room). Washable vinyl, roller-type shades are optimal.  6. Remove all non-washable stuffed toys from the bedroom. Wash stuffed toys weekly like sheets and blankets above.  7. Reduce indoor humidity to less than 50%. Inexpensive humidity monitors can be purchased at most hardware stores. Do not use a humidifier as can make the problem worse and are not recommended.  Control of Dog or Cat Allergen Avoidance is the best way to manage a dog or cat allergy. If you have a dog or cat and are allergic to dog or cats, consider removing the dog or cat from the  home. If you have a dog or cat but don't want to find it a new home, or if your family wants a pet even though someone in the household is allergic, here are some strategies that may help keep symptoms at bay:  Keep the pet out of your bedroom and restrict it to only a few rooms. Be advised that keeping the dog or cat in only one room will not limit the allergens to that room. Don't pet, hug or kiss the dog or cat; if you do, wash your hands with soap and water. High-efficiency particulate air (HEPA) cleaners run continuously in a bedroom or living room can reduce allergen levels over time. Regular use of a high-efficiency vacuum cleaner or a central vacuum can reduce allergen levels. Giving your dog or cat a bath at least once a week can reduce airborne allergen.

## 2023-05-29 NOTE — Progress Notes (Signed)
[address] [place] [address] Dept: [phone]  FOLLOW UP NOTE  Patient ID: Cheryl Mata, female    DOB: 21-Apr-1971  Age: 52 y.o. MRN: [id number] Date of Office Visit: 05/29/2023  Assessment  Chief Complaint: Cough  HPI [name] is a 52 year old female who presents to the clinic for a sick visit.  She was last seen in this clinic on 02/14/2023 by Dr. Dellis Anes for evaluation of asthma, allergic rhinitis, and stinging insect allergy.  In the interim, she reports that she began to experience symptoms including fever, chills, and body aches that began last week Sunday, 8 days ago.  She reports that she began to experience asthma symptoms on Tuesday including chest tightness, shortness of breath, wheeze, and cough and started using her albuterol inhaler.  She went to her primary care provider on Wednesday where she tested negative for COVID, influenza A/B, and RSV.  At that time she received Augmentin and a 6-day prednisone taper.  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  At today's visit, she reports that she continues to experience shortness of breath with cough, wheezing in the daytime and nighttime, and cough producing thick clear mucus and some dry cough.  She reports using a home oximetry reader with results in the 91 to 99% oxygen saturation range.  She continues albuterol 2-3 times a day with mild relief of symptoms.  She is not using an inhaled corticosteroid at this time. She reports that she is on day 5 of a 6 day prednisone taper and continues Augmentin.  Allergic rhinitis is reported as moderately well-controlled with some clear rhinorrhea as the main symptom.  She denies nasal congestion, sneezing, and postnasal drainage. She has been taking Mucinex DM with mild relief of symptoms. She is not currently taking an antihistamine, using a steroid nasal spray, or  saline nasal rinse.  Follow-up her last environmental allergy skin testing was on and was positive to grass pollen, weed pollen, tree pollen, mold, dust mite, cat, dog and horse.  She denies reflux and is not currently taking a medication to control reflux.  She denies insect sting since her last visit to this clinic.  Her epinephrine autoinjector set is within date.  Her current medications are listed in the chart.  Drug Allergies:  Allergies  Allergen Reactions   Yellow Jacket Venom [Bee Venom] Anaphylaxis    Physical Exam: BP 120/70   Pulse 98   Temp 97.9 F (36.6 C) (Temporal)   Resp 16   Wt 124 lb 3.2 oz (56.3 kg)   SpO2 99%   BMI 21.66 kg/m    Physical Exam Vitals reviewed.  Constitutional:      Appearance: Normal appearance.  HENT:     Head: Normocephalic and atraumatic.     Right Ear: Tympanic membrane normal.     Left Ear: Tympanic membrane normal.     Nose:     Comments: Bilateral nares edematous with thin clear nasal drainage noted.  Pharynx erythematous with no exudate.  Ears normal.  Eyes normal.    Mouth/Throat:     Pharynx: Oropharynx is clear.  Eyes:     Conjunctiva/sclera: Conjunctivae normal.  Cardiovascular:     Rate and Rhythm: Normal rate and regular rhythm.     Heart sounds: Normal heart sounds. No murmur heard. Pulmonary:     Comments: Scattered rhonchi which cleared  with cough.  No wheeze noted.  Persistent cough producing mucus throughout the interview and examination portion of the visit Musculoskeletal:        General: Normal range of motion.     Cervical back: Normal range of motion and neck supple.  Skin:    General: Skin is warm and dry.  Neurological:     Mental Status: She is alert and oriented to person, place, and time.  Psychiatric:        Mood and Affect: Mood normal.        Behavior: Behavior normal.        Thought Content: Thought content normal.        Judgment: Judgment normal.     Diagnostics: Spirometry deferred due  to recent fever and viral like illness  Assessment and Plan: 1. Mild persistent asthma with acute exacerbation   2. Seasonal and perennial allergic rhinitis   3. 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 Acute cough   5. Hymenoptera allergy     Meds ordered this encounter  Medications   budesonide (PULMICORT) 0.5 MG/2ML nebulizer solution    Sig: Use twice a day for the next 1-2 weeks or until cough and wheeze free    Dispense:  60 mL    Refill:  1   benzonatate (TESSALON) 200 MG capsule    Sig: Take 1 capsule (200 mg total) by mouth 3 (three) times daily as needed for cough.    Dispense:  20 capsule    Refill:  0   albuterol (PROVENTIL) (2.5 MG/3ML) 0.083% nebulizer solution    Sig: Take 3 mLs (2.5 mg total) by nebulization every 4 (four) hours as needed for wheezing or shortness of breath.    Dispense:  75 mL    Refill:  1   famotidine (PEPCID) 20 MG tablet    Sig: Take 20 mg once a day for the next 2 weeks, then take 20 mg once a day as needed    Dispense:  30 tablet    Refill:  1    Patient Instructions  Asthma Begin budesonide 0.5 mg twice a day via nebulizer for the next 2 weeks or until you are cough and wheeze free. Pre-treat with albuterol  Continue albuterol 2 puffs every 4 hours as needed for cough or wheeze OR instead use albuterol 0.083% solution via nebulizer one unit vial every 4 hours as needed for cough or wheeze  Allergic rhinitis Continue allergen avoidance measures directed toward grass pollen, weed pollen, tree pollen, mold, dust mite, cat, dog, and reports as listed below Begin Flonase or Nasacort 2 sprays in each nostril once a day for nasal congestion Consider saline nasal rinses as needed for nasal symptoms. Use this before any medicated nasal sprays for best result For thick postnasal drainage, begin Mucinex 600 to 1200 mg twice a day and increase fluid intake as tolerated  Reflux Continue dietary lifestyle modifications as listed below Begin famotidine  20 mg once a day for the next 1 to 2 weeks and then you may use famotidine as needed for control of reflux  Cough Continue the treatment plan as listed above for asthma, allergic rhinitis, and reflux. Begin Tessalon Perles 200 mg once every 3 hours as needed for cough If your cough worsens or if you develop a fever call the clinic and we will move forward with chest x-ray  Stinging insect allergy Continue to avoid stinging insects and have access to an epinephrine autoinjector set  Call the clinic if this treatment plan is not working well for you.  Follow up in 2 weeks or sooner if needed.   Return in about 2 weeks (around 06/12/2023), or if symptoms worsen or fail to improve.    Thank you for the opportunity to care for this patient.  Please do not hesitate to contact me with questions.  Thermon Leyland, FNP [facility]

## 2023-06-01 ENCOUNTER — Telehealth: Payer: Self-pay

## 2023-06-01 ENCOUNTER — Ambulatory Visit
Admission: RE | Admit: 2023-06-01 | Discharge: 2023-06-01 | Disposition: A | Discharge status: Home / Self Care | Payer: BC Managed Care – PPO | Source: Ambulatory Visit | Attending: Family Medicine | Admitting: Family Medicine

## 2023-06-01 DIAGNOSIS — R059 Cough, unspecified: Secondary | ICD-10-CM | POA: Diagnosis not present

## 2023-06-01 DIAGNOSIS — R0602 Shortness of breath: Secondary | ICD-10-CM

## 2023-06-01 DIAGNOSIS — R062 Wheezing: Secondary | ICD-10-CM

## 2023-06-01 DIAGNOSIS — R051 Acute cough: Secondary | ICD-10-CM

## 2023-06-01 NOTE — Telephone Encounter (Signed)
Can you please order a stat chest xray 2 view no contrast at [facility] and let the patient know to go get this? Thank you

## 2023-06-01 NOTE — Telephone Encounter (Signed)
Patient was seen on 05/29/23 with Thurston Hole. Patient has not gotten better & now has a low grade fever ranging from 99.5-100.85F. Her cough has also become uncontrollable.  Patient is requesting some advice on what to do next.   [facility] [address]

## 2023-06-01 NOTE — Telephone Encounter (Signed)
Called patient - DOB verified - advised of provider notation below.   Patient verbalized understanding to all, no further questions.

## 2023-06-02 ENCOUNTER — Other Ambulatory Visit: Payer: Self-pay | Admitting: Family Medicine

## 2023-06-02 DIAGNOSIS — J189 Pneumonia, unspecified organism: Secondary | ICD-10-CM

## 2023-06-02 MED ORDER — CEFDINIR 300 MG PO CAPS: 300.0000 mg | ORAL_CAPSULE | Freq: Two times a day (BID) | ORAL | 0 refills | Status: DC

## 2023-06-02 MED ORDER — AZITHROMYCIN 250 MG PO TABS: ORAL_TABLET | ORAL | 0 refills | Status: DC

## 2023-06-02 NOTE — Progress Notes (Signed)
Patient notified of xray results and abx called in. See note.

## 2023-06-02 NOTE — Progress Notes (Signed)
Patient notified of pneumonia on chest xray. Notified of 2 separate antibiotics to take for current pneumonia. Will get follow up chest xray in 3 weeks. Patient will call the clinic with worsening symptoms or if she develops a fever. Patient encouraged to call the on call physician if she has any worsening symptoms. Patient encouraged to take a probiotic and call the clinic with any abdominal pain or diarrhea.

## 2023-06-08 DIAGNOSIS — M41126 Adolescent idiopathic scoliosis, lumbar region: Secondary | ICD-10-CM | POA: Diagnosis not present

## 2023-06-08 DIAGNOSIS — M9901 Segmental and somatic dysfunction of cervical region: Secondary | ICD-10-CM | POA: Diagnosis not present

## 2023-06-08 DIAGNOSIS — M9902 Segmental and somatic dysfunction of thoracic region: Secondary | ICD-10-CM | POA: Diagnosis not present

## 2023-06-08 DIAGNOSIS — M5442 Lumbago with sciatica, left side: Secondary | ICD-10-CM | POA: Diagnosis not present

## 2023-06-12 DIAGNOSIS — M9903 Segmental and somatic dysfunction of lumbar region: Secondary | ICD-10-CM | POA: Diagnosis not present

## 2023-06-12 DIAGNOSIS — M9901 Segmental and somatic dysfunction of cervical region: Secondary | ICD-10-CM | POA: Diagnosis not present

## 2023-06-12 DIAGNOSIS — M546 Pain in thoracic spine: Secondary | ICD-10-CM | POA: Diagnosis not present

## 2023-06-20 DIAGNOSIS — M546 Pain in thoracic spine: Secondary | ICD-10-CM | POA: Diagnosis not present

## 2023-06-20 DIAGNOSIS — M9901 Segmental and somatic dysfunction of cervical region: Secondary | ICD-10-CM | POA: Diagnosis not present

## 2023-06-20 DIAGNOSIS — M9903 Segmental and somatic dysfunction of lumbar region: Secondary | ICD-10-CM | POA: Diagnosis not present

## 2023-06-22 DIAGNOSIS — M9903 Segmental and somatic dysfunction of lumbar region: Secondary | ICD-10-CM | POA: Diagnosis not present

## 2023-06-22 DIAGNOSIS — M546 Pain in thoracic spine: Secondary | ICD-10-CM | POA: Diagnosis not present

## 2023-06-22 DIAGNOSIS — M9901 Segmental and somatic dysfunction of cervical region: Secondary | ICD-10-CM | POA: Diagnosis not present

## 2023-06-26 ENCOUNTER — Ambulatory Visit (INDEPENDENT_AMBULATORY_CARE_PROVIDER_SITE_OTHER): Payer: BC Managed Care – PPO | Admitting: Family Medicine

## 2023-06-26 ENCOUNTER — Encounter: Payer: Self-pay | Admitting: Family Medicine

## 2023-06-26 ENCOUNTER — Other Ambulatory Visit: Payer: Self-pay

## 2023-06-26 VITALS — BP 106/68 | HR 66 | Temp 97.9°F | Resp 16 | Wt 122.9 lb

## 2023-06-26 DIAGNOSIS — R490 Dysphonia: Secondary | ICD-10-CM | POA: Diagnosis not present

## 2023-06-26 DIAGNOSIS — H6992 Unspecified Eustachian tube disorder, left ear: Secondary | ICD-10-CM | POA: Diagnosis not present

## 2023-06-26 DIAGNOSIS — Z91038 Other insect allergy status: Secondary | ICD-10-CM

## 2023-06-26 DIAGNOSIS — Z8701 Personal history of pneumonia (recurrent): Secondary | ICD-10-CM | POA: Insufficient documentation

## 2023-06-26 DIAGNOSIS — J3089 Other allergic rhinitis: Secondary | ICD-10-CM | POA: Diagnosis not present

## 2023-06-26 DIAGNOSIS — J302 Other seasonal allergic rhinitis: Secondary | ICD-10-CM

## 2023-06-26 DIAGNOSIS — K219 Gastro-esophageal reflux disease without esophagitis: Secondary | ICD-10-CM | POA: Insufficient documentation

## 2023-06-26 DIAGNOSIS — J4530 Mild persistent asthma, uncomplicated: Secondary | ICD-10-CM

## 2023-06-26 NOTE — Patient Instructions (Addendum)
Asthma Continue albuterol 2 puffs every 4 hours as needed for cough or wheeze OR instead use albuterol 0.083% solution via nebulizer one unit vial every 4 hours as needed for cough or wheeze For asthma flare, begin budesonide 0.5 ml twice a day via nebulizer for 1-2 weeks or until cough and wheeze free  Allergic rhinitis Continue allergen avoidance measures directed toward grass pollen, weed pollen, tree pollen, mold, dust mite, cat, dog, and reports as listed below Begin Flonase or Nasacort 2 sprays in each nostril once a day for nasal congestion.  In the right nostril, point the applicator out toward the right ear. In the left nostril, point the applicator out toward the left ear Consider saline nasal rinses as needed for nasal symptoms. Use this before any medicated nasal sprays for best result For thick postnasal drainage, begin Mucinex 600 to 1200 mg twice a day and increase fluid intake as tolerated Consider allergen immunotherapy if your symptoms are not well-controlled with the treatment plan as listed above  Reflux Continue dietary lifestyle modifications as listed below  Stinging insect allergy Continue to avoid stinging insects and have access to an epinephrine autoinjector set  Recent pneumonia An order has been placed for a chest x-ray post pneumonia. Continue to keep track of infections, antibiotic use, and steroid use  Eustachian tube dysfunction Begin saline nasal rinses followed by Flonase  Voice hoarseness If your voice does not return to baseline with the treatment plan as listed above, call the clinic and we will refer you to an ENT specialist for examination with rhinoscopy for further evaluation  Call the clinic if this treatment plan is not working well for you.  Follow up in 2 months or sooner if needed.  Reducing Pollen Exposure The American Academy of Allergy, Asthma and Immunology suggests the following steps to reduce your exposure to pollen during allergy  seasons. Do not hang sheets or clothing out to dry; pollen may collect on these items. Do not mow lawns or spend time around freshly cut grass; mowing stirs up pollen. Keep windows closed at night.  Keep car windows closed while driving. Minimize morning activities outdoors, a time when pollen counts are usually at their highest. Stay indoors as much as possible when pollen counts or humidity is high and on windy days when pollen tends to remain in the air longer. Use air conditioning when possible.  Many air conditioners have filters that trap the pollen spores. Use a HEPA room air filter to remove pollen form the indoor air you breathe.  Control of Mold Allergen Mold and fungi can grow on a variety of surfaces provided certain temperature and moisture conditions exist.  Outdoor molds grow on plants, decaying vegetation and soil.  The major outdoor mold, Alternaria and Cladosporium, are found in very high numbers during hot and dry conditions.  Generally, a late Summer - Fall peak is seen for common outdoor fungal spores.  Rain will temporarily lower outdoor mold spore count, but counts rise rapidly when the rainy period ends.  The most important indoor molds are Aspergillus and Penicillium.  Dark, humid and poorly ventilated basements are ideal sites for mold growth.  The next most common sites of mold growth are the bathroom and the kitchen.  Outdoor [employer] Use air conditioning and keep windows closed Avoid exposure to decaying vegetation. Avoid leaf raking. Avoid grain handling. Consider wearing a face mask if working in moldy areas.  Indoor Mold Control Maintain humidity below 50%. Clean washable  surfaces with 5% bleach solution. Remove sources e.g. Contaminated carpets.   Control of Dust Mite Allergen Dust mites play a major role in allergic asthma and rhinitis. They occur in environments with high humidity wherever human skin is found. Dust mites absorb humidity from the  atmosphere (ie, they do not drink) and feed on organic matter (including shed human and animal skin). Dust mites are a microscopic type of insect that you cannot see with the naked eye. High levels of dust mites have been detected from mattresses, pillows, carpets, upholstered furniture, bed covers, clothes, soft toys and any woven material. The principal allergen of the dust mite is found in its feces. A gram of dust may contain 1,000 mites and 250,000 fecal particles. Mite antigen is easily measured in the air during house cleaning activities. Dust mites do not bite and do not cause harm to humans, other than by triggering allergies/asthma.  Ways to decrease your exposure to dust mites in your home:  1. Encase mattresses, box springs and pillows with a mite-impermeable barrier or cover  2. Wash sheets, blankets and drapes weekly in hot water (130 F) with detergent and dry them in a dryer on the hot setting.  3. Have the room cleaned frequently with a vacuum cleaner and a damp dust-mop. For carpeting or rugs, vacuuming with a vacuum cleaner equipped with a high-efficiency particulate air (HEPA) filter. The dust mite allergic individual should not be in a room which is being cleaned and should wait 1 hour after cleaning before going into the room.  4. Do not sleep on upholstered furniture (eg, couches).  5. If possible removing carpeting, upholstered furniture and drapery from the home is ideal. Horizontal blinds should be eliminated in the rooms where the person spends the most time (bedroom, study, television room). Washable vinyl, roller-type shades are optimal.  6. Remove all non-washable stuffed toys from the bedroom. Wash stuffed toys weekly like sheets and blankets above.  7. Reduce indoor humidity to less than 50%. Inexpensive humidity monitors can be purchased at most hardware stores. Do not use a humidifier as can make the problem worse and are not recommended.  Control of Dog or Cat  Allergen Avoidance is the best way to manage a dog or cat allergy. If you have a dog or cat and are allergic to dog or cats, consider removing the dog or cat from the home. If you have a dog or cat but don't want to find it a new home, or if your family wants a pet even though someone in the household is allergic, here are some strategies that may help keep symptoms at bay:  Keep the pet out of your bedroom and restrict it to only a few rooms. Be advised that keeping the dog or cat in only one room will not limit the allergens to that room. Don't pet, hug or kiss the dog or cat; if you do, wash your hands with soap and water. High-efficiency particulate air (HEPA) cleaners run continuously in a bedroom or living room can reduce allergen levels over time. Regular use of a high-efficiency vacuum cleaner or a central vacuum can reduce allergen levels. Giving your dog or cat a bath at least once a week can reduce airborne allergen.

## 2023-06-26 NOTE — Progress Notes (Signed)
[address] [place] [address] Dept: [phone]  FOLLOW UP NOTE  Patient ID: Cheryl Mata, female    DOB: July 27, 1971  Age: 52 y.o. MRN: [id number] Date of Office Visit: 06/26/2023  Assessment  Chief Complaint: Follow-up (Things are better but still hoarse. Left ear is completed stopped up and can not hear anything out of it. )  HPI Chapman Moss   Discussed the use of AI scribe software for clinical note transcription with the patient, who gave verbal consent to proceed.  History of Present Illness   The patient, with a history of asthma, presents with a persistent cough and voice changes following a recent bout of pneumonia. They report that their voice has improved compared to the previous week, but they continue to experience breathlessness, particularly when climbing stairs or engaging in strenuous activities. They also note occasional coughing, with a combination of productive and dry coughs, but deny any nocturnal disturbances due to coughing. She continues albuterol a few days a week with relief of symptoms. She last used Pulmicort about 2 weeks ago.   Allergic rhinitis is reported as moderately well-controlled with copious postnasal drainage as the main symptom.  She continues occasional nasal saline rinses and is not currently taking an antihistamine or nasal steroid spray.    She does report that her left ear has been clogged for the last several weeks.  She reports that she did hear it pop open this morning.  She denies ear pain in either ear as well as drainage from either ear.  She continues to experience voice hoarseness which interestingly, cleared while she was at the beach about 1 and half weeks ago and then returned once she got home.  She reports that before about 2 months ago she has not had any trouble with voice hoarseness.  She denies sore throat, fever, sweats, or chills.  Reflux is reported as well-controlled with no symptoms including heartburn or  vomiting.  She is not currently taking a medication to control reflux.  She continues to avoid stinging insects with no incidences or EpiPen use since her last visit to this clinic.  EpiPen sent is up-to-date.   Her current medications are listed in the chart.   Drug Allergies:  Allergies  Allergen Reactions   Yellow Jacket Venom [Bee Venom] Anaphylaxis    Physical Exam: BP 106/68   Pulse 66   Temp 97.9 F (36.6 C) (Temporal)   Resp 16   Wt 122 lb 14.4 oz (55.7 kg)   SpO2 97%   BMI 21.43 kg/m    Physical Exam Vitals reviewed.  Constitutional:      Appearance: Normal appearance.  HENT:     Head: Normocephalic and atraumatic.     Right Ear: Tympanic membrane normal.     Left Ear: Tympanic membrane normal.     Nose:     Comments: Bilateral nares erythematous with thin clear nasal drainage noted.  Pharynx erythematous with no exudate.  Thick postnasal drainage noted in the pharynx.  Ears normal.  Eyes normal. Eyes:     Conjunctiva/sclera: Conjunctivae normal.  Cardiovascular:     Rate and Rhythm: Normal rate and regular rhythm.     Heart sounds: Normal heart sounds. No murmur heard. Pulmonary:     Effort: Pulmonary effort is normal.     Breath sounds: Normal breath sounds.     Comments: Lungs clear to auscultation Musculoskeletal:        General: Normal range of motion.  Cervical back: Normal range of motion and neck supple.  Skin:    General: Skin is warm and dry.  Neurological:     Mental Status: She is alert and oriented to person, place, and time.  Psychiatric:        Mood and Affect: Mood normal.        Behavior: Behavior normal.        Thought Content: Thought content normal.        Judgment: Judgment normal.     Assessment and Plan: 1. Mild persistent asthma without complication   2. Seasonal and perennial allergic rhinitis   3. Dysfunction of left eustachian tube   4. Voice hoarseness   5. Hymenoptera allergy   6. Gastroesophageal reflux disease,  unspecified whether esophagitis present   7. History of pneumonia     No orders of the defined types were placed in this encounter.   Patient Instructions  Asthma Continue albuterol 2 puffs every 4 hours as needed for cough or wheeze OR instead use albuterol 0.083% solution via nebulizer one unit vial every 4 hours as needed for cough or wheeze For asthma flare, begin budesonide 0.5 ml twice a day via nebulizer for 1-2 weeks or until cough and wheeze free  Allergic rhinitis Continue allergen avoidance measures directed toward grass pollen, weed pollen, tree pollen, mold, dust mite, cat, dog, and reports as listed below Begin Flonase or Nasacort 2 sprays in each nostril once a day for nasal congestion.  In the right nostril, point the applicator out toward the right ear. In the left nostril, point the applicator out toward the left ear Consider saline nasal rinses as needed for nasal symptoms. Use this before any medicated nasal sprays for best result For thick postnasal drainage, begin Mucinex 600 to 1200 mg twice a day and increase fluid intake as tolerated Consider allergen immunotherapy if your symptoms are not well-controlled with the treatment plan as listed above  Reflux Continue dietary lifestyle modifications as listed below  Stinging insect allergy Continue to avoid stinging insects and have access to an epinephrine autoinjector set  Recent pneumonia An order has been placed for a chest x-ray post pneumonia. Continue to keep track of infections, antibiotic use, and steroid use  Eustachian tube dysfunction Begin saline nasal rinses followed by Flonase  Voice hoarseness If your voice does not return to baseline with the treatment plan as listed above, call the clinic and we will refer you to an ENT specialist for examination with rhinoscopy for further evaluation  Call the clinic if this treatment plan is not working well for you.  Follow up in 2 months or sooner if  needed.   Return in about 2 months (around 08/26/2023), or if symptoms worsen or fail to improve.    Thank you for the opportunity to care for this patient.  Please do not hesitate to contact me with questions.  Thermon Leyland, FNP [facility]

## 2023-06-27 DIAGNOSIS — M546 Pain in thoracic spine: Secondary | ICD-10-CM | POA: Diagnosis not present

## 2023-06-27 DIAGNOSIS — M9901 Segmental and somatic dysfunction of cervical region: Secondary | ICD-10-CM | POA: Diagnosis not present

## 2023-06-27 DIAGNOSIS — M9903 Segmental and somatic dysfunction of lumbar region: Secondary | ICD-10-CM | POA: Diagnosis not present

## 2023-07-03 DIAGNOSIS — M9901 Segmental and somatic dysfunction of cervical region: Secondary | ICD-10-CM | POA: Diagnosis not present

## 2023-07-03 DIAGNOSIS — M9903 Segmental and somatic dysfunction of lumbar region: Secondary | ICD-10-CM | POA: Diagnosis not present

## 2023-07-03 DIAGNOSIS — M546 Pain in thoracic spine: Secondary | ICD-10-CM | POA: Diagnosis not present

## 2023-07-05 ENCOUNTER — Ambulatory Visit
Admission: RE | Admit: 2023-07-05 | Discharge: 2023-07-05 | Disposition: A | Discharge status: Home / Self Care | Payer: BC Managed Care – PPO | Source: Ambulatory Visit | Attending: Family Medicine | Admitting: Family Medicine

## 2023-07-05 DIAGNOSIS — M542 Cervicalgia: Secondary | ICD-10-CM | POA: Diagnosis not present

## 2023-07-05 DIAGNOSIS — R059 Cough, unspecified: Secondary | ICD-10-CM | POA: Diagnosis not present

## 2023-07-05 DIAGNOSIS — R0602 Shortness of breath: Secondary | ICD-10-CM | POA: Diagnosis not present

## 2023-07-05 DIAGNOSIS — J189 Pneumonia, unspecified organism: Secondary | ICD-10-CM

## 2023-07-05 DIAGNOSIS — M25551 Pain in right hip: Secondary | ICD-10-CM | POA: Diagnosis not present

## 2023-07-05 DIAGNOSIS — M5442 Lumbago with sciatica, left side: Secondary | ICD-10-CM | POA: Diagnosis not present

## 2023-07-11 DIAGNOSIS — M9902 Segmental and somatic dysfunction of thoracic region: Secondary | ICD-10-CM | POA: Diagnosis not present

## 2023-07-11 DIAGNOSIS — M25551 Pain in right hip: Secondary | ICD-10-CM | POA: Diagnosis not present

## 2023-07-11 DIAGNOSIS — M5442 Lumbago with sciatica, left side: Secondary | ICD-10-CM | POA: Diagnosis not present

## 2023-07-11 DIAGNOSIS — M9901 Segmental and somatic dysfunction of cervical region: Secondary | ICD-10-CM | POA: Diagnosis not present

## 2023-07-19 ENCOUNTER — Telehealth: Payer: Self-pay | Admitting: Family Medicine

## 2023-07-19 DIAGNOSIS — M25551 Pain in right hip: Secondary | ICD-10-CM | POA: Diagnosis not present

## 2023-07-19 DIAGNOSIS — M9901 Segmental and somatic dysfunction of cervical region: Secondary | ICD-10-CM | POA: Diagnosis not present

## 2023-07-19 DIAGNOSIS — M9902 Segmental and somatic dysfunction of thoracic region: Secondary | ICD-10-CM | POA: Diagnosis not present

## 2023-07-19 NOTE — Telephone Encounter (Signed)
Pt states she has not heard back from anyone about her results.

## 2023-07-19 NOTE — Telephone Encounter (Signed)
Pt was wondering about the chest xray results

## 2023-07-20 NOTE — Telephone Encounter (Signed)
Contacted patient to report that her xray result had not been posted at this time. She reports that she began to experience cough producing mucus on Tuesday and wheeze with voice change that began on Monday.  She has started budesonide via nebulizer last night and has used albuterol a few times with temporary relief of symptoms.  She continues Nasonex and nasal saline rinses.  She agrees to use budesonide 0.5 mg twice a day and continue Nasonex and nasal saline rinses daily.  She will call with any fever or worsening symptoms.  We will contact her when the x-ray results become available.

## 2023-07-25 DIAGNOSIS — M25551 Pain in right hip: Secondary | ICD-10-CM | POA: Diagnosis not present

## 2023-07-25 DIAGNOSIS — M9901 Segmental and somatic dysfunction of cervical region: Secondary | ICD-10-CM | POA: Diagnosis not present

## 2023-07-25 DIAGNOSIS — M9902 Segmental and somatic dysfunction of thoracic region: Secondary | ICD-10-CM | POA: Diagnosis not present

## 2023-07-28 NOTE — Progress Notes (Signed)
Patient notified of chest xray results. The results came in 3 weeks after the cxr was preformed. Patient denies any clinical symptoms including fever, shortness of breath or cough. She does report slight wheeze occurring last night only [place] for which she used albuterol with relief of wheeze. She agrees to return for follow up in about 2-3 weeks at her scheduled appointment and will call the clinic should any symptoms develop.

## 2023-07-31 ENCOUNTER — Other Ambulatory Visit: Payer: Self-pay | Admitting: Gastroenterology

## 2023-08-01 DIAGNOSIS — M9902 Segmental and somatic dysfunction of thoracic region: Secondary | ICD-10-CM | POA: Diagnosis not present

## 2023-08-01 DIAGNOSIS — M9901 Segmental and somatic dysfunction of cervical region: Secondary | ICD-10-CM | POA: Diagnosis not present

## 2023-08-01 DIAGNOSIS — M5442 Lumbago with sciatica, left side: Secondary | ICD-10-CM | POA: Diagnosis not present

## 2023-08-09 DIAGNOSIS — M9901 Segmental and somatic dysfunction of cervical region: Secondary | ICD-10-CM | POA: Diagnosis not present

## 2023-08-09 DIAGNOSIS — M9902 Segmental and somatic dysfunction of thoracic region: Secondary | ICD-10-CM | POA: Diagnosis not present

## 2023-08-15 DIAGNOSIS — M9901 Segmental and somatic dysfunction of cervical region: Secondary | ICD-10-CM | POA: Diagnosis not present

## 2023-08-15 DIAGNOSIS — M9902 Segmental and somatic dysfunction of thoracic region: Secondary | ICD-10-CM | POA: Diagnosis not present

## 2023-08-17 ENCOUNTER — Encounter: Payer: Self-pay | Admitting: Allergy & Immunology

## 2023-08-17 ENCOUNTER — Ambulatory Visit (INDEPENDENT_AMBULATORY_CARE_PROVIDER_SITE_OTHER): Payer: BC Managed Care – PPO | Admitting: Allergy & Immunology

## 2023-08-17 ENCOUNTER — Other Ambulatory Visit: Payer: Self-pay

## 2023-08-17 VITALS — BP 116/72 | HR 80 | Temp 98.1°F | Resp 16 | Ht 62.99 in | Wt 120.8 lb

## 2023-08-17 DIAGNOSIS — K219 Gastro-esophageal reflux disease without esophagitis: Secondary | ICD-10-CM

## 2023-08-17 DIAGNOSIS — B999 Unspecified infectious disease: Secondary | ICD-10-CM

## 2023-08-17 DIAGNOSIS — J4530 Mild persistent asthma, uncomplicated: Secondary | ICD-10-CM

## 2023-08-17 DIAGNOSIS — J302 Other seasonal allergic rhinitis: Secondary | ICD-10-CM

## 2023-08-17 DIAGNOSIS — H6992 Unspecified Eustachian tube disorder, left ear: Secondary | ICD-10-CM

## 2023-08-17 DIAGNOSIS — J3089 Other allergic rhinitis: Secondary | ICD-10-CM

## 2023-08-17 NOTE — Progress Notes (Signed)
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 Mild persistent asthma, uncomplicated - Lung testing looks great today. - I think your lungs have recovered.  - I do not think that we need another CXR.  - Daily controller medication(s): NONE - Prior to physical activity: ProAir 2 puffs 10-15 minutes before physical activity. - Rescue medications: ProAir 4 puffs every 4-6 hours as needed - Changes during respiratory infections or worsening symptoms: Add on Pulmicort 0.5 mg twice daily via nebulizer twice daily for coughing/wheezing for TWO WEEKS. - Asthma control goals:  * Full participation in all desired activities (may need albuterol before activity) * Albuterol use two time or less a week on average (not counting use with activity) * Cough interfering with sleep two time or less a month * Oral steroids no more than once a year * No hospitalizations  2. Allergic rhinitis (grasses, weeds, trees, indoor molds, dust mites, cat, dog, and horse) - Continue with Xyzal 5mg  once daily.  3. 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   4. Recurrent infections - We will obtain some screening labs to evaluate your immune system.  - Labs to evaluate the quantitative [facility]) aspects of your immune system: IgG/IgA/IgM, CBC with differential - Labs to evaluate the qualitative (HOW WELL THEY WORK) aspects of your immune system: CH50, Pneumococcal titers, Tetanus titers, Diphtheria titers - We may consider immunizations with Pneumovax and Tdap to challenge your immune system, and then obtain repeat titers in 4-6 weeks.    5. Return in about 1 year (around 08/16/2024). You can have the follow up appointment with Dr. Dellis Anes or a Nurse Practicioner (our Nurse Practitioners are excellent and always have Physician oversight!).    Subjective:   Cheryl Mata is a 52 y.o. female presenting today for follow up of  Chief Complaint  Patient presents with   Asthma    States that it's okay other than when she goes out in the cold since the weather triggers it.   Allergic Rhinitis     States that they're doing about the same.   Angioedema    States that it's been doing good.    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  Discussed the use of AI scribe software for clinical note transcription with the patient and/or guardian, who gave verbal consent to proceed.  Cheryl Mata is a 52 y.o. female presenting for a follow up visit.  She was last seen in October 2024.  At that time, she was continued on albuterol as needed with budesonide added during flares.  For  her allergic rhinitis, she was started on Flonase and continued on nasal saline rinses.  Reflux was controlled with dietary modifications.  She has an EpiPen for her stinging insect allergy.  She did get an x-ray to make sure that her pneumonia had cleared.  For her hoarseness, I discussed seeing ENT.  Since last visit, she has had an unfortunately eventful course.   She has a recent diagnosis of  pneumonia, reports this was the most severe illness she has ever experienced. She describes a period of extreme discomfort, to the point of wishing to be left on the street. Despite the severity of her symptoms, she did not require hospitalization. The initial x-ray confirmed the diagnosis of pneumonia, and a follow-up x-ray, taken almost three weeks later, showed persistent signs of the illness, although the patient was already feeling better by this time. It looks like she was treated with cefdinir and azithromycin for pneumonia. During the illness, the patient experienced fever, body aches, and headaches. She was tested for COVID-19, flu, and RSV, all of which were negative. She was prescribed albuterol for wheezing, which provided some relief.  The patient underwent multiple courses of antibiotics, including cefdinir and azithromycin, and also received steroids. She reports that her left ear was completely blocked during the illness, but has since improved, although she still experiences some issues with it. She also lost her voice for approximately two months. This episode of pneumonia is the first for the patient, and it appears to be an out-of-the-blue infection. The patient has not been this ill since she has been under our care.   She has a history of sinus infections, occurring approximately twice a year prior to the COVID-19 pandemic. She has not had a sinus infection since the start of the pandemic, which she attributes to wearing a mask. She also reports that her nose has been dry and bloody, but she has not experienced full nosebleeds. She has never had an immune workup.   Asthma/Respiratory Symptom History: Aside from her recent episode of PNA, she has generally done fairly well.  Cheryl Mata's asthma has been well controlled. She has not required rescue medication, experienced nocturnal awakenings due to lower respiratory symptoms, nor have activities of daily living been limited. She has required  no Emergency Department or Urgent Care visits for her asthma. She has required zero courses of systemic steroids for asthma exacerbations since the last visit. ACT score today is 25, indicating excellent asthma symptom control. She did have Symbicort Jerral Ralph) at one point, but she she has not used it in over 6 months. She has not used it routinely in several years.   Allergic Rhinitis Symptom History: Rhinitis symptoms are generally controlled with the levocetirizine daily. She is not even using the Dymista on a routine basis at this point.  She has some left fullness that is residual from her recent illness. Otherwise she has been stable.   She has not had any recent insect stings. She does need a new epinephrine autoinjector.  Otherwise, there have been no changes to her past medical history, surgical history, family history, or social history.    Review of systems otherwise negative other than that mentioned in the HPI.    Objective:   Blood pressure 116/72, pulse 80, temperature 98.1 F (36.7 C), temperature source Temporal, resp. rate 16, height 5' 2.99" (1.6 m), weight 120 lb 12.8 oz (54.8 kg), SpO2 98%. Body mass index is 21.4 kg/m.    Physical Exam Vitals reviewed.  Constitutional:      Appearance: She is well-developed.     Comments: Pleasant. Intermittent hoarseness.   HENT:     Head: Normocephalic and atraumatic.     Right Ear: Tympanic membrane, ear canal and external ear normal.     Left Ear: Tympanic membrane, ear canal and external ear normal.     Nose: No nasal deformity, septal deviation, mucosal edema or rhinorrhea.     Right Turbinates: Enlarged, swollen and pale.     Left Turbinates: Enlarged, swollen and pale.     Right Sinus: No maxillary sinus tenderness or frontal sinus tenderness.     Left Sinus: No maxillary sinus tenderness or frontal sinus tenderness.     Comments: No nasal polyps noted.    Mouth/Throat:     Lips: Pink.     Mouth: Mucous membranes  are moist. Mucous membranes are not pale and not dry.     Pharynx: Uvula midline.     Comments: Cobblestoning present in the posterior oropharynx.  Eyes:     General:        Right eye: No discharge.        Left eye: No discharge.     Conjunctiva/sclera: Conjunctivae normal.     Right eye: Right conjunctiva is not injected. No chemosis.    Left eye: Left conjunctiva is not injected. No chemosis.    Pupils: Pupils are equal, round, and reactive to light.  Cardiovascular:     Rate and Rhythm: Normal rate and regular rhythm.     Heart sounds: Normal heart sounds.  Pulmonary:     Effort: Pulmonary effort is normal. No tachypnea, accessory muscle usage or respiratory distress.     Breath sounds: No decreased breath sounds, wheezing, rhonchi or rales.  Chest:     Chest wall: No tenderness.  Lymphadenopathy:     Cervical: No cervical adenopathy.  Skin:    Coloration: Skin is not pale.     Findings: No abrasion, erythema, petechiae or rash. Rash is not papular, urticarial or vesicular.  Neurological:     Mental Status: She is alert.  Psychiatric:        Behavior: Behavior is cooperative.      Diagnostic studies:    Spirometry: results normal (FEV1: 2.32/92%, FVC: 3.25/104%, FEV1/FVC: 71%).    Spirometry consistent with normal pattern.    Allergy Studies: labs sent instead       Malachi Bonds, MD  [facility]

## 2023-08-17 NOTE — Patient Instructions (Addendum)
1. Mild persistent asthma, uncomplicated - Lung testing looks great today. - I think your lungs have recovered.  - I do not think that we need another CXR.  - Daily controller medication(s): NONE - Prior to physical activity: ProAir 2 puffs 10-15 minutes before physical activity. - Rescue medications: ProAir 4 puffs every 4-6 hours as needed - Changes during respiratory infections or worsening symptoms: Add on Pulmicort 0.5 mg twice daily via nebulizer twice daily for coughing/wheezing for TWO WEEKS. - Asthma control goals:  * Full participation in all desired activities (may need albuterol before activity) * Albuterol use two time or less a week on average (not counting use with activity) * Cough interfering with sleep two time or less a month * Oral steroids no more than once a year * No hospitalizations  2. Allergic rhinitis (grasses, weeds, trees, indoor molds, dust mites, cat, dog, and horse) - Continue with Xyzal 5mg  once daily.  3. 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   4. Recurrent infections - We will obtain some screening labs to evaluate your immune system.  - Labs to evaluate the quantitative [facility]) aspects of your immune system: IgG/IgA/IgM, CBC with differential - Labs to evaluate the qualitative (HOW WELL THEY WORK) aspects of your immune system: CH50, Pneumococcal titers, Tetanus titers, Diphtheria titers - We may consider immunizations with Pneumovax and Tdap to challenge your immune system, and then obtain repeat titers in 4-6 weeks.   5. Return in about 1 year (around 08/16/2024). You can have the follow up appointment with Dr. Dellis Anes or a Nurse Practicioner (our Nurse Practitioners are excellent and always have Physician oversight!).    Please inform us of any Emergency Department visits, hospitalizations, or changes in symptoms. Call us before going to the ED for breathing or allergy symptoms since we might be able to fit you in for a sick  visit. Feel free to contact us anytime with any questions, problems, or concerns.  It was a pleasure to see you again today!  Websites that have reliable patient information: 1. American Academy of Asthma, Allergy, and Immunology: www.aaaai.org 2. Food Allergy Research and Education (FARE): foodallergy.org 3. Mothers of Asthmatics: http://www.asthmacommunitynetwork.org 4. American College of Allergy, Asthma, and Immunology: www.acaai.org      "Like" [place] on Facebook and Instagram for our latest updates!      A healthy democracy works best when [employer] participate! Make sure you are registered to vote! If you have moved or changed any of your contact information, you will need to get this updated before voting! Scan the QR codes below to learn more!

## 2023-08-21 ENCOUNTER — Encounter: Payer: Self-pay | Admitting: Allergy & Immunology

## 2023-08-22 DIAGNOSIS — M9901 Segmental and somatic dysfunction of cervical region: Secondary | ICD-10-CM | POA: Diagnosis not present

## 2023-08-22 DIAGNOSIS — M9902 Segmental and somatic dysfunction of thoracic region: Secondary | ICD-10-CM | POA: Diagnosis not present

## 2023-08-25 LAB — STREP PNEUMONIAE 23 SEROTYPES IGG
Pneumo Ab Type 1*: <0.1 ug/mL — ABNORMAL LOW (ref >1.3)
Pneumo Ab Type 12 (12F)*: <0.1 ug/mL — ABNORMAL LOW (ref >1.3)
Pneumo Ab Type 14*: >30.6 ug/mL (ref >1.3)
Pneumo Ab Type 17 (17F)*: <0.1 ug/mL — ABNORMAL LOW (ref >1.3)
Pneumo Ab Type 19 (19F)*: 1.1 ug/mL — ABNORMAL LOW (ref >1.3)
Pneumo Ab Type 2*: 0.4 ug/mL — ABNORMAL LOW (ref >1.3)
Pneumo Ab Type 20*: <0.2 ug/mL — ABNORMAL LOW (ref >1.3)
Pneumo Ab Type 22 (22F)*: 0.3 ug/mL — ABNORMAL LOW (ref >1.3)
Pneumo Ab Type 23 (23F)*: 0.7 ug/mL — ABNORMAL LOW (ref >1.3)
Pneumo Ab Type 26 (6B)*: <0.1 ug/mL — ABNORMAL LOW (ref >1.3)
Pneumo Ab Type 3*: <0.1 ug/mL — ABNORMAL LOW (ref >1.3)
Pneumo Ab Type 34 (10A)*: <0.1 ug/mL — ABNORMAL LOW (ref >1.3)
Pneumo Ab Type 4*: <0.1 ug/mL — ABNORMAL LOW (ref >1.3)
Pneumo Ab Type 43 (11A)*: 0.6 ug/mL — ABNORMAL LOW (ref >1.3)
Pneumo Ab Type 5*: 0.7 ug/mL — ABNORMAL LOW (ref >1.3)
Pneumo Ab Type 51 (7F)*: <0.1 ug/mL — ABNORMAL LOW (ref >1.3)
Pneumo Ab Type 54 (15B)*: 3.1 ug/mL (ref >1.3)
Pneumo Ab Type 56 (18C)*: 0.1 ug/mL — ABNORMAL LOW (ref >1.3)
Pneumo Ab Type 57 (19A)*: 1.3 ug/mL — ABNORMAL LOW (ref >1.3)
Pneumo Ab Type 68 (9V)*: <0.1 ug/mL — ABNORMAL LOW (ref >1.3)
Pneumo Ab Type 70 (33F)*: 1.2 ug/mL — ABNORMAL LOW (ref >1.3)
Pneumo Ab Type 8*: <0.3 ug/mL — ABNORMAL LOW (ref >1.3)
Pneumo Ab Type 9 (9N)*: <0.1 ug/mL — ABNORMAL LOW (ref >1.3)

## 2023-08-25 LAB — IGG, IGA, IGM
IgA/Immunoglobulin A, Serum: 155 mg/dL (ref 87–352)
IgG (Immunoglobin G), Serum: 888 mg/dL (ref 586–1602)
IgM (Immunoglobulin M), Srm: 90 mg/dL (ref 26–217)

## 2023-08-25 LAB — CBC WITH DIFFERENTIAL/PLATELET
Basophils Absolute: 0 10*3/uL (ref 0.0–0.2)
Basos: 1 %
EOS (ABSOLUTE): 0.1 10*3/uL (ref 0.0–0.4)
Eos: 3 %
Hematocrit: 43.6 % (ref 34.0–46.6)
Hemoglobin: 14.2 g/dL (ref 11.1–15.9)
Immature Grans (Abs): 0 10*3/uL (ref 0.0–0.1)
Immature Granulocytes: 0 %
Lymphocytes Absolute: 1.4 10*3/uL (ref 0.7–3.1)
Lymphs: 30 %
MCH: 27.8 pg (ref 26.6–33.0)
MCHC: 32.6 g/dL (ref 31.5–35.7)
MCV: 86 fL (ref 79–97)
Monocytes Absolute: 0.3 10*3/uL (ref 0.1–0.9)
Monocytes: 7 %
Neutrophils Absolute: 2.7 10*3/uL (ref 1.4–7.0)
Neutrophils: 59 %
Platelets: 207 10*3/uL (ref 150–450)
RBC: 5.1 x10E6/uL (ref 3.77–5.28)
RDW: 12.8 % (ref 11.7–15.4)
WBC: 4.6 10*3/uL (ref 3.4–10.8)

## 2023-08-25 LAB — COMPLEMENT, TOTAL: Compl, Total (CH50): >60 U/mL (ref >41)

## 2023-08-25 LAB — DIPHTHERIA / TETANUS ANTIBODY PANEL
Diphtheria Ab: 0.41 [IU]/mL (ref <0.10)
Tetanus Ab, IgG: 1.9 [IU]/mL (ref <0.10)

## 2023-09-04 DIAGNOSIS — M9902 Segmental and somatic dysfunction of thoracic region: Secondary | ICD-10-CM | POA: Diagnosis not present

## 2023-09-04 DIAGNOSIS — M9901 Segmental and somatic dysfunction of cervical region: Secondary | ICD-10-CM | POA: Diagnosis not present

## 2023-09-05 ENCOUNTER — Ambulatory Visit (INDEPENDENT_AMBULATORY_CARE_PROVIDER_SITE_OTHER): Payer: BC Managed Care – PPO | Admitting: *Deleted

## 2023-09-05 DIAGNOSIS — Z23 Encounter for immunization: Secondary | ICD-10-CM

## 2023-09-05 DIAGNOSIS — B999 Unspecified infectious disease: Secondary | ICD-10-CM | POA: Diagnosis not present

## 2023-09-05 NOTE — Progress Notes (Signed)
 Immunotherapy   Patient Details  Name: Cheryl Mata MRN: [id number] Date of Birth: [birth year]  09/05/2023  [name] Search  Patient received Pneumovax today in the Right Deltoid and waited 15 min in office without issue.  LOT [id number] EXP 09/28/2024 NDC [id number] Consent signed and patient instructions given.   [name] 09/05/2023, 9:14 AM

## 2023-09-11 DIAGNOSIS — M41126 Adolescent idiopathic scoliosis, lumbar region: Secondary | ICD-10-CM | POA: Diagnosis not present

## 2023-09-11 DIAGNOSIS — M9901 Segmental and somatic dysfunction of cervical region: Secondary | ICD-10-CM | POA: Diagnosis not present

## 2023-09-11 DIAGNOSIS — M5442 Lumbago with sciatica, left side: Secondary | ICD-10-CM | POA: Diagnosis not present

## 2023-09-11 DIAGNOSIS — M9902 Segmental and somatic dysfunction of thoracic region: Secondary | ICD-10-CM | POA: Diagnosis not present

## 2023-09-18 DIAGNOSIS — M9902 Segmental and somatic dysfunction of thoracic region: Secondary | ICD-10-CM | POA: Diagnosis not present

## 2023-09-18 DIAGNOSIS — M9901 Segmental and somatic dysfunction of cervical region: Secondary | ICD-10-CM | POA: Diagnosis not present

## 2023-10-19 DIAGNOSIS — D1801 Hemangioma of skin and subcutaneous tissue: Secondary | ICD-10-CM | POA: Diagnosis not present

## 2023-10-19 DIAGNOSIS — Z808 Family history of malignant neoplasm of other organs or systems: Secondary | ICD-10-CM | POA: Diagnosis not present

## 2023-10-19 DIAGNOSIS — D2372 Other benign neoplasm of skin of left lower limb, including hip: Secondary | ICD-10-CM | POA: Diagnosis not present

## 2023-10-19 DIAGNOSIS — L814 Other melanin hyperpigmentation: Secondary | ICD-10-CM | POA: Diagnosis not present

## 2023-10-24 ENCOUNTER — Telehealth: Payer: Self-pay | Admitting: Allergy & Immunology

## 2023-10-24 DIAGNOSIS — B999 Unspecified infectious disease: Secondary | ICD-10-CM

## 2023-10-24 NOTE — Telephone Encounter (Signed)
Patient called stating she is needing repeat labs after having her pnemovax about 8 weeks ago.

## 2023-10-25 NOTE — Telephone Encounter (Signed)
Lab has been ordered she can come into clinic or go to any [facility] to have lab redrawn

## 2023-10-28 ENCOUNTER — Other Ambulatory Visit: Payer: Self-pay | Admitting: Gastroenterology

## 2023-10-30 DIAGNOSIS — R7309 Other abnormal glucose: Secondary | ICD-10-CM | POA: Diagnosis not present

## 2023-10-30 DIAGNOSIS — Z1272 Encounter for screening for malignant neoplasm of vagina: Secondary | ICD-10-CM | POA: Diagnosis not present

## 2023-10-30 DIAGNOSIS — Z1329 Encounter for screening for other suspected endocrine disorder: Secondary | ICD-10-CM | POA: Diagnosis not present

## 2023-10-30 DIAGNOSIS — Z1231 Encounter for screening mammogram for malignant neoplasm of breast: Secondary | ICD-10-CM | POA: Diagnosis not present

## 2023-10-30 DIAGNOSIS — E7800 Pure hypercholesterolemia, unspecified: Secondary | ICD-10-CM | POA: Diagnosis not present

## 2023-10-30 DIAGNOSIS — Z6821 Body mass index (BMI) 21.0-21.9, adult: Secondary | ICD-10-CM | POA: Diagnosis not present

## 2023-10-30 DIAGNOSIS — Z01419 Encounter for gynecological examination (general) (routine) without abnormal findings: Secondary | ICD-10-CM | POA: Diagnosis not present

## 2023-12-12 DIAGNOSIS — M654 Radial styloid tenosynovitis [de Quervain]: Secondary | ICD-10-CM | POA: Diagnosis not present

## 2023-12-13 DIAGNOSIS — H02051 Trichiasis without entropian right upper eyelid: Secondary | ICD-10-CM | POA: Diagnosis not present

## 2023-12-21 ENCOUNTER — Telehealth: Payer: Self-pay | Admitting: Gastroenterology

## 2023-12-21 ENCOUNTER — Other Ambulatory Visit: Payer: Self-pay | Admitting: Gastroenterology

## 2023-12-21 MED ORDER — LINACLOTIDE 72 MCG PO CAPS: 72.0000 ug | ORAL_CAPSULE | Freq: Every day | ORAL | 1 refills | Status: DC

## 2023-12-21 NOTE — Telephone Encounter (Signed)
 Patient called and stated that she is needing a refill on her medication called Linzess. Patient was scheduled for July 8th at 2:00. Please advise.

## 2023-12-21 NOTE — Telephone Encounter (Signed)
Rx for Linzess sent to pharmacy as requested.  

## 2024-03-07 ENCOUNTER — Other Ambulatory Visit: Payer: Self-pay | Admitting: Gastroenterology

## 2024-03-12 ENCOUNTER — Encounter: Payer: Self-pay | Admitting: Gastroenterology

## 2024-03-12 ENCOUNTER — Ambulatory Visit (INDEPENDENT_AMBULATORY_CARE_PROVIDER_SITE_OTHER): Admitting: Gastroenterology

## 2024-03-12 VITALS — BP 124/70 | HR 88 | Ht 62.25 in | Wt 125.5 lb

## 2024-03-12 DIAGNOSIS — K581 Irritable bowel syndrome with constipation: Secondary | ICD-10-CM | POA: Diagnosis not present

## 2024-03-12 DIAGNOSIS — Z800 Family history of malignant neoplasm of digestive organs: Secondary | ICD-10-CM | POA: Diagnosis not present

## 2024-03-12 DIAGNOSIS — K641 Second degree hemorrhoids: Secondary | ICD-10-CM

## 2024-03-12 DIAGNOSIS — Z860101 Personal history of adenomatous and serrated colon polyps: Secondary | ICD-10-CM | POA: Diagnosis not present

## 2024-03-12 DIAGNOSIS — Z860100 Personal history of colon polyps, unspecified: Secondary | ICD-10-CM

## 2024-03-12 MED ORDER — LINACLOTIDE 145 MCG PO CAPS: 145.0000 ug | ORAL_CAPSULE | Freq: Every day | ORAL | 3 refills | Status: DC

## 2024-03-12 NOTE — Progress Notes (Signed)
 Chief Complaint:    IBS-C, medication refill  GI History: 53 year old female with a history of mild persistent asthma, allergic rhinitis, followed in the GI clinic for the following:  1) IBS. Longstanding history of IBS-C.  Baseline 1-2 BM/week.  Exacerbation of constipation in 2023 following appendectomy and cecectomy earlier that year. 2) History of colon polyps 3) Family history of colon cancer.  Brother with colon cancer in his 30s 4) Internal hemorrhoids.  Symptomatic hemorrhoids, worse with constipation  - 10/08/2021 colonoscopy with 4-5 mm of residual, recurrent polypoid mucosa in the cecum, new mucus Polypoid type lesion measuring 1-2 cm and clearly growing into her from the appendiceal orifice, recommended referral to [facility] for segmental colectomy and appendectomy.  Pathology showed sessile serrated polyps.  Recall colonoscopy recommended in 3 years. - 01/19/2022 Underwent extended appendecomy with cecal cuff in 5/ 2023 with clear margins. - 01/28/2022 patient contacted and told she needed a repeat colonoscopy in 6 months given surgical pathology of sessile serrated polyp/tubular adenoma - 05/2022.  Patient developed constipation and bloating.  Started MiraLAX 1 cap daily and chewable fiber - 06/13/2022.  ER evaluation for constipation and bloating.  Recommended MiraLAX purge cycle creatinine 1.12, normal CMP, CBC, lipase, UA. CTAP showed moderate volume of stool throughout the colon with rectal distention suggesting constipation and possible fecal impaction, no evidence of bowel obstruction or inflammation.  Patient declined manual disimpaction in ER - 07/01/2022.  Follow-up in the GI clinic.  Symptoms resolved after MiraLAX purge, but then recurred.  Started Linzess  72 mcg daily and scheduled for colonoscopy with 2-day prep. - 08/08/2022.  Colonoscopy: 5 mm cecal polyp located adjacent to prior surgical scar (path: SSP), 2 mm cecal polyp (path: Adenoma), 4 mm sigmoid polyp (path: Adenoma),  internal hemorrhoids.  Anatomy consistent with prior appendectomy with cecal cuff.  Repeat 3 years    HPI:     Patient is a 53 y.o. female presenting to the Gastroenterology Clinic for follow-up and  refill of Linzess .  Was last seen in the GI clinic on 07/01/2022.  Started Linzess  72 mcg daily then.  Initially reported good clinical response and has maintained that dose.    More recently with exacerbation of constipation.  Tends to be worse with stress, and was certainly worse with her son's college graduation, marriage, and commission and into the [employer] recently.  Had increased straining and hard stools with BM every 5-7 days over the last several weeks with increased hemorrhoidal symptoms with BRBPR.  Did continue her Linzess  at 72 mcg daily, but otherwise did not try MiraLAX or any other laxatives/stool softeners during this exacerbation.  Over the last week, symptoms have started to improve.  Now back to BM every 3-4 days, but still with hard stools and still with symptomatic hemorrhoids.  Typically consumes 60+ ounces of water daily.  Maintains high-fiber diet, but not any fiber supplement.  For her hemorrhoids, has been using Preparation H daily for a long time.   Reviewed most recent labs from 08/2023: Normal CBC, IgA, IgM, IgG.  No recent abdominal imaging for review.  Review of systems:     No chest pain, no SOB, no fevers, no urinary sx   Past Medical History:  Diagnosis Date   Allergy    Asthma    IBS (irritable bowel syndrome)     Patient's surgical history, family medical history, social history, medications and allergies were all reviewed in Epic    Current Outpatient Medications  Medication Sig Dispense Refill  acyclovir (ZOVIRAX) 200 MG capsule Take 200 mg by mouth See admin instructions. Take up to 5 times daily for cold sores (Patient not taking: Reported on 08/17/2023)     albuterol  (PROVENTIL ) (2.5 MG/3ML) 0.083% nebulizer solution Take 3 mLs (2.5 mg total) by  nebulization every 4 (four) hours as needed for wheezing or shortness of breath. 75 mL 1   albuterol  (VENTOLIN  HFA) 108 (90 Base) MCG/ACT inhaler Inhale 2 puffs into the lungs every 4 (four) hours as needed. 18 g 1   albuterol  (VENTOLIN  HFA) 108 (90 Base) MCG/ACT inhaler Inhale 4 puffs every 4-6 hours as needed for cough/wheeze/shortness of breath. 18 g 1   Azelastine -Fluticasone  137-50 MCG/ACT SUSP 1-2 Sprays in each nostil daily as needed. (Patient not taking: Reported on 08/17/2023) 23 g 5   azithromycin  (ZITHROMAX ) 250 MG tablet Take 2 tablets on the first day, then take 1 tablet once a day for the next 4 days, then stop (Patient not taking: Reported on 08/17/2023) 6 each 0   benzonatate  (TESSALON ) 200 MG capsule Take 1 capsule (200 mg total) by mouth 3 (three) times daily as needed for cough. (Patient not taking: Reported on 08/17/2023) 20 capsule 0   budesonide  (PULMICORT ) 0.5 MG/2ML nebulizer solution Use twice a day for the next 1-2 weeks or until cough and wheeze free 60 mL 1   cefdinir  (OMNICEF ) 300 MG capsule Take 1 capsule (300 mg total) by mouth 2 (two) times daily. (Patient not taking: Reported on 08/17/2023) 20 capsule 0   EPINEPHrine  (EPIPEN  2-PAK) 0.3 mg/0.3 mL IJ SOAJ injection Use as directed for severe allergic reaction 2 each 1   famotidine  (PEPCID ) 20 MG tablet Take 20 mg once a day for the next 2 weeks, then take 20 mg once a day as needed (Patient not taking: Reported on 08/17/2023) 30 tablet 1   levocetirizine (XYZAL ) 5 MG tablet TAKE 1 TABLET(5 MG) BY MOUTH EVERY EVENING (Patient not taking: Reported on 08/17/2023) 30 tablet 5   linaclotide  (LINZESS ) 72 MCG capsule Take 1 capsule (72 mcg total) by mouth daily before breakfast. Please keep appointment for 03-12-24 at 2pm 30 capsule 0   predniSONE  (DELTASONE ) 10 MG tablet take 6 tablets on day 1, 5 on day 2, 4 on day 3, 3 on day 4, 2 on day 5 and 1 on day 6 Orally Once a day for 6 days (Patient not taking: Reported on 08/17/2023)      PREVIDENT 5000 PLUS 1.1 % CREA dental cream See admin instructions.     promethazine-dextromethorphan (PROMETHAZINE-DM) 6.25-15 MG/5ML syrup 5 mL as needed Orally every 6 hrs for 7 days (Patient not taking: Reported on 08/17/2023)     Vitamin D, Ergocalciferol, (DRISDOL) 1.25 MG (50000 UNIT) CAPS capsule Take 50,000 Units by mouth once a week.     No current facility-administered medications for this visit.    Physical Exam:     There were no vitals taken for this visit.  GENERAL:  Pleasant female in NAD PSYCH: : Cooperative, normal affect NEURO: Alert and oriented x 3, no focal neurologic deficits   IMPRESSION and PLAN:    1) IBS-C - Increase Linzess  to 145 mcg daily.  She will use her current 72 mcg tablets (2 tabs daily) until completed, then will write Rx for 145 mcg tab daily - Maintain adequate hydration with the 64 ounces of water daily - Hold MiraLAX for now.  If needed, can start 7-10 days after initiating higher dose Linzess  - If ongoing  constipation, increase to 290 mcg daily or potentially change therapy  2) Symptomatic hemorrhoids 3) Grade 2 internal hemorrhoids Colonoscopy in 08/2022 with grade 2 internal hemorrhoids.  Symptoms exacerbated with recent worsening constipation. - Stop Preparation H now and cautioned on extended use of Preparation H or other topical steroid creams - Discussed role/utility of hemorrhoid banding.  She would like to see if hemorrhoid symptoms resolve with improvement in constipation as above first.  Can contact me if she would like to go through hemorrhoid banding - Up-to-date on colon cancer screening  4) Personal history of colon polyps 5) Family history of colon cancer 6) History of extended appendectomy with cecal cuff in 01/2022 - Repeat colonoscopy in 08/2025 for ongoing surveillance     RTC in 6-12 months or sooner as needed      Sandor LULLA Flatter ,DO, FACG 03/12/2024, 1:52 PM

## 2024-03-12 NOTE — Patient Instructions (Signed)
 _______________________________________________________  If your blood pressure at your visit was 140/90 or greater, please contact your primary care physician to follow up on this.  _______________________________________________________  If you are age 53 or older, your body mass index should be between 23-30. Your Body mass index is 22.77 kg/m. If this is out of the aforementioned range listed, please consider follow up with your Primary Care Provider.  If you are age 44 or younger, your body mass index should be between 19-25. Your Body mass index is 22.77 kg/m. If this is out of the aformentioned range listed, please consider follow up with your Primary Care Provider.   ________________________________________________________  The  GI providers would like to encourage you to use MYCHART to communicate with providers for non-urgent requests or questions.  Due to long hold times on the telephone, sending your provider a message by [facility] may be a faster and more efficient way to get a response.  Please allow 48 business hours for a response.  Please remember that this is for non-urgent requests.  _______________________________________________________  We have sent the following medications to your pharmacy for you to pick up at your convenience:  INCREASE: Linzess  to 145mcg daily  Linzess  works best when taken once a day every day, on an empty stomach, at least 30 minutes before your first meal of the day.  When Linzess  is taken daily as directed:  *Constipation relief is typically felt in about a week *IBS-C patients may begin to experience relief from belly pain and overall abdominal symptoms (pain, discomfort, and bloating) in about 1 week,   with symptoms typically improving over 12 weeks.  Diarrhea may occur in the first 2 weeks -keep taking it.  The diarrhea should go away and you should start having normal, complete, full bowel movements. It may be helpful to start  treatment when you can be near the comfort of your own bathroom, such as a weekend.   It was a pleasure to see you today!  Vito Cirigliano, D.O.

## 2024-04-15 ENCOUNTER — Encounter: Payer: Self-pay | Admitting: Gastroenterology

## 2024-04-22 ENCOUNTER — Other Ambulatory Visit (HOSPITAL_COMMUNITY): Payer: Self-pay

## 2024-04-22 MED ORDER — LINACLOTIDE 290 MCG PO CAPS: 290.0000 ug | ORAL_CAPSULE | Freq: Every day | ORAL | 3 refills | Status: AC

## 2024-04-22 NOTE — Telephone Encounter (Signed)
 No PA required. Insurance does pay max of 30 day supply at a time. Co-pay is $54.46

## 2024-04-22 NOTE — Telephone Encounter (Signed)
 Can we submit PA for Linzess  290 mcg if needed. Thanks

## 2024-04-26 DIAGNOSIS — M654 Radial styloid tenosynovitis [de Quervain]: Secondary | ICD-10-CM | POA: Diagnosis not present

## 2024-05-16 DIAGNOSIS — M654 Radial styloid tenosynovitis [de Quervain]: Secondary | ICD-10-CM | POA: Diagnosis not present

## 2024-07-31 DIAGNOSIS — J069 Acute upper respiratory infection, unspecified: Secondary | ICD-10-CM | POA: Diagnosis not present

## 2024-07-31 DIAGNOSIS — H6120 Impacted cerumen, unspecified ear: Secondary | ICD-10-CM | POA: Diagnosis not present

## 2024-08-06 DIAGNOSIS — M25562 Pain in left knee: Secondary | ICD-10-CM | POA: Diagnosis not present

## 2024-08-06 DIAGNOSIS — S76312A Strain of muscle, fascia and tendon of the posterior muscle group at thigh level, left thigh, initial encounter: Secondary | ICD-10-CM | POA: Diagnosis not present

## 2024-08-20 ENCOUNTER — Ambulatory Visit: Payer: BC Managed Care – PPO | Admitting: Allergy & Immunology

## 2024-08-22 ENCOUNTER — Encounter: Payer: Self-pay | Admitting: Allergy & Immunology

## 2024-08-22 ENCOUNTER — Other Ambulatory Visit: Payer: Self-pay

## 2024-08-22 ENCOUNTER — Ambulatory Visit: Admitting: Allergy & Immunology

## 2024-08-22 ENCOUNTER — Ambulatory Visit

## 2024-08-22 VITALS — BP 126/88 | HR 85 | Temp 98.5°F | Resp 16 | Ht 63.0 in | Wt 128.0 lb

## 2024-08-22 DIAGNOSIS — H6121 Impacted cerumen, right ear: Secondary | ICD-10-CM

## 2024-08-22 DIAGNOSIS — J302 Other seasonal allergic rhinitis: Secondary | ICD-10-CM

## 2024-08-22 DIAGNOSIS — J3089 Other allergic rhinitis: Secondary | ICD-10-CM | POA: Diagnosis not present

## 2024-08-22 DIAGNOSIS — J4530 Mild persistent asthma, uncomplicated: Secondary | ICD-10-CM | POA: Diagnosis not present

## 2024-08-22 MED ORDER — ALBUTEROL SULFATE HFA 108 (90 BASE) MCG/ACT IN AERS: 2.0000 | INHALATION_SPRAY | RESPIRATORY_TRACT | 1 refills | Status: AC | PRN

## 2024-08-22 NOTE — Progress Notes (Unsigned)
 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   Subjective:   Cheryl Mata is a 53 y.o. female presenting today for follow up of  Chief Complaint  Patient presents with   Follow-up    Yearly check up, she says her asthma is doing good. 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  Discussed the use of AI scribe software for clinical note transcription with the patient and/or guardian, who gave verbal consent to proceed.  Cheryl Mata is a 54 y.o. female presenting for {Blank single:19197::a food challenge,a drug challenge,skin testing,a sick visit,an evaluation of ***,a follow up visit}.  She was last seen in December 2024.  At that time, lung testing was great.  We continue with albuterol  as needed.  She had Pulmicort  that she has during flares.  For her allergic rhinitis,  continue with Xyzal .  EpiPen  was renewed for her stinging insect allergies.  We did get an immune workup as well.  Immune workup was notable for inadequate protection again Streptococcus pneumonia.  She was only protected to 2 out of 23 serotypes.  We recommended that she get a Pneumovax or Prevnar and repeat titers.  Since last visit, she has done very well.    Asthma/Respiratory Symptom History: ***  Allergic Rhinitis Symptom History: ***  Food Allergy Symptom History: ***  Skin Symptom History: ***  GERD Symptom History: ***  Infection Symptom History: ***  Otherwise, there have been no changes to her past medical history, surgical history, family history, or social history.    Review of systems otherwise negative other than that mentioned in the HPI.    Objective:   Blood pressure 126/88, pulse 85, temperature 98.5 F (36.9 C), temperature source Temporal, resp. rate 16, height 5' 3 (1.6 m), weight 128 lb (58.1 kg), SpO2 97%. Body mass index is 22.67 kg/m.    Physical Exam   Diagnostic studies:    Spirometry: results normal (FEV1: 2.44/95%, FVC: 3.01/93%, FEV1/FVC: 81%).    Spirometry consistent with normal pattern. {Blank single:19197::Albuterol /Atrovent nebulizer,Xopenex/Atrovent nebulizer,Albuterol  nebulizer,Albuterol  four puffs via MDI,Xopenex four puffs via MDI} treatment given in clinic with {Blank single:19197::significant improvement in FEV1 per ATS criteria,significant improvement in FVC per ATS criteria,significant improvement in FEV1 and FVC per ATS criteria,improvement in FEV1, but not significant per ATS criteria,improvement in FVC, but not significant per ATS criteria,improvement in FEV1 and FVC, but not significant per ATS criteria,no improvement}.  Allergy Studies: {Blank single:19197::none,deferred due to recent antihistamine use,deferred due to insurance stipulations that require a separate visit for testing,labs sent  instead, }    {Blank single:19197::Allergy testing results were read and interpreted by myself, documented by clinical staff., }      Marty Shaggy, MD  [facility] 

## 2024-08-22 NOTE — Patient Instructions (Addendum)
 1. Mild persistent asthma, uncomplicated - Lung testing looks great today. - Daily controller medication(s): NONE - Prior to physical activity: ProAir  2 puffs 10-15 minutes before physical activity. - Rescue medications: ProAir  4 puffs every 4-6 hours as needed - Changes during respiratory infections or worsening symptoms: Add on Pulmicort  0.5 mg twice daily via nebulizer twice daily for coughing/wheezing for TWO WEEKS. - Asthma control goals:  * Full participation in all desired activities (may need albuterol  before activity) * Albuterol  use two time or less a week on average (not counting use with activity) * Cough interfering with sleep two time or less a month * Oral steroids no more than once a year * No hospitalizations  2. Allergic rhinitis (grasses, weeds, trees, indoor molds, dust mites, cat, dog, and horse) - Continue with Xyzal  5mg  once daily.  3. 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   4. Recurrent infections - Pneumovax given today in clinic. - We will recheck your titers in 4-6 weeks - You can get these labs at any [facility].   5. Return in about 6 months (around 02/20/2025). You can have the follow up appointment with Dr. Iva or a Nurse Practicioner (our Nurse Practitioners are excellent and always have Physician oversight!).    Please inform us  of any Emergency Department visits, hospitalizations, or changes in symptoms. Call us  before going to the ED for breathing or allergy symptoms since we might be able to fit you in for a sick visit. Feel free to contact us  anytime with any questions, problems, or concerns.  It was a pleasure to see you again today!  Websites that have reliable patient information: 1. American Academy of Asthma, Allergy, and Immunology: www.aaaai.org 2. Food Allergy Research and Education (FARE): foodallergy.org 3. Mothers of Asthmatics: http://www.asthmacommunitynetwork.org 4. American College of Allergy, Asthma, and  Immunology: www.acaai.org      Like us  on [employer] and Instagram for our latest updates!      A healthy democracy works best when [employer] participate! Make sure you are registered to vote! If you have moved or changed any of your contact information, you will need to get this updated before voting! Scan the QR codes below to learn more!

## 2024-08-23 ENCOUNTER — Encounter (INDEPENDENT_AMBULATORY_CARE_PROVIDER_SITE_OTHER): Payer: Self-pay

## 2024-08-23 DIAGNOSIS — M654 Radial styloid tenosynovitis [de Quervain]: Secondary | ICD-10-CM | POA: Diagnosis not present

## 2024-08-26 ENCOUNTER — Encounter: Payer: Self-pay | Admitting: Allergy & Immunology

## 2024-09-18 ENCOUNTER — Ambulatory Visit (INDEPENDENT_AMBULATORY_CARE_PROVIDER_SITE_OTHER): Admitting: Physician Assistant

## 2024-09-18 ENCOUNTER — Encounter (INDEPENDENT_AMBULATORY_CARE_PROVIDER_SITE_OTHER): Payer: Self-pay | Admitting: Physician Assistant

## 2024-09-18 VITALS — BP 145/81 | HR 84 | Ht 63.0 in | Wt 128.0 lb

## 2024-09-18 DIAGNOSIS — H6121 Impacted cerumen, right ear: Secondary | ICD-10-CM | POA: Diagnosis not present

## 2024-09-19 NOTE — Progress Notes (Signed)
 Dear Dr. Iva, Here is my assessment for our mutual patient, Cheryl Mata. Thank you for allowing me the opportunity to care for your patient. Please do not hesitate to contact me should you have any other questions. Sincerely, [name] PA-C  Otolaryngology Clinic Note Referring provider: Dr. Iva HPI:  Cheryl Mata is a 54 y.o. female kindly referred by Dr. Iva   Discussed the use of AI scribe software for clinical note transcription with the patient, who gave verbal consent to proceed.  History of Present Illness   Cheryl Mata is a 54 year old female with recurrent otitis media and prior tympanostomy tube placement who presents with right ear fullness and hearing loss.  She developed acute right ear occlusion and hearing loss a few days prior to the visit, describing the ear as completely closed. This presentation is atypical for her, as previous episodes of otitis media and tympanic membrane scarring have predominantly affected the left ear, where she underwent four tympanostomy tube placements during childhood.  The right ear symptoms began following an upper respiratory illness shortly before Thanksgiving 2025. She was initially advised to use Debrox for presumed cerumen impaction, but after two weeks of use, there was no improvement. Subsequent evaluation by her allergist in December 2025 led to ENT referral after unsuccessful cerumen removal attempts.  She experienced pneumonia in September 2024, after which she noted intermittent episodes of ear closure and fluctuating hearing loss. She reports that when lying on her side, she is unable to hear the television. Since the pneumonia, ear symptoms have been intermittent, but following her recent illness in November 2025, the right ear became completely occluded again, with recurrence of symptoms last night and persisting into the morning of the visit.  Prior to the COVID-19 pandemic, she typically experienced upper  respiratory infections with associated otitis media approximately twice yearly, most often involving bronchitis. During the pandemic, the frequency of illness decreased, with only one significant episode (pneumonia in September 2024) since then.  She discontinued use of Q-tips several years ago due to concerns about tympanic membrane injury. She has used Debrox for cerumen management as previously instructed, but found it ineffective for her current symptoms.  Her current symptoms are isolated to the right ear.           Independent Review of Additional Tests or Records:  none   PMH/Meds/All/SocHx/FamHx/ROS:   Past Medical History:  Diagnosis Date   Allergy    Asthma    IBS (irritable bowel syndrome)      Past Surgical History:  Procedure Laterality Date   AUGMENTATION MAMMAPLASTY Bilateral 2005   BREAST BIOPSY  2015   and 2017   BREAST ENHANCEMENT SURGERY Bilateral 2005   CARPAL TUNNEL RELEASE Right 2020   COLONOSCOPY  10/08/2021   FOOT SURGERY Bilateral 2009   rods both  feet  with bunionectomy  and 2011   LAPAROSCOPIC APPENDECTOMY N/A 01/19/2022   Procedure: APPENDECTOMY LAPAROSCOPIC;  Surgeon: Lyndel Deward PARAS, MD;  Location: [facility];  Service: General;  Laterality: N/A;   PARTIAL HYSTERECTOMY  2005   TONSILLECTOMY     as a child   TYMPANOSTOMY TUBE PLACEMENT     as a child    Family History  Problem Relation Age of Onset   Breast cancer Mother 23   Hypertension Mother    Hypertension Father    Colon cancer Brother 67       dx'd in his 41's   Crohn's disease Brother  Colon polyps Brother    Colon cancer Other    Crohn's disease Daughter    Crohn's disease Cousin    Esophageal cancer Neg Hx    Rectal cancer Neg Hx    Stomach cancer Neg Hx      Social Connections: Not on file     Current Medications[1]   Physical Exam:   BP (!) 145/81 (BP Location: Left Arm, Patient Position: Sitting, Cuff Size: Normal)   Pulse 84   Ht 5' 3 (1.6 m)   Wt 128  lb (58.1 kg)   SpO2 96%   BMI 22.67 kg/m   Pertinent Findings  CN II-XII grossly intact Right ear cerumen impaction, left EAC clear TM intact; right TM minimal sclerosis  Anterior rhinoscopy: Septum midline; bilateral inferior turbinates with no hypertrophy No lesions of oral cavity/oropharynx; dentition wnl No obviously palpable neck masses/lymphadenopathy/thyromegaly No respiratory distress or stridor  Seprately Identifiable Procedures:  Procedure: bilateral ear microscopy and cerumen removal using microscope (CPT 30789) - Mod 25 Pre-procedure diagnosis: unilateral cerumen impaction right external auditory canal Post-procedure diagnosis: same Indication: unilateral  cerumen impaction; given patient's otologic complaints and history as well as for improved and comprehensive examination of external ear and tympanic membrane, bilateral otologic examination using microscope was performed and impacted cerumen removed  Procedure: Patient was placed semi-recumbent. Both ear canals were examined using the microscope with findings above. Cerumen removed from the right external auditory canal using suction and currette with improvement in EAC examination and patency. Left: EAC was patent. TM was intact . Middle ear was aerated. Drainage: none Right: EAC was patent. TM was intact . Middle ear was aerated . Drainage: none Patient tolerated the procedure well.   Impression & Plans:  Cheryl Mata is a 54 y.o. female with the following   Assessment and Plan    Impacted cerumen, right ear Intermittent right aural occlusion and hearing loss due to cerumen impaction. No active infection or middle ear effusion. Symptoms attributed to cerumen impaction. - Removed impacted cerumen from the right ear. - Advised follow-up in six months for re-evaluation and possible ear cleaning. - Provided anticipatory guidance to return sooner if symptoms such as aural fullness or recurrent infections recur. -  Discussed extending follow-up to one year if minimal cerumen is present at follow-up.    - f/u PRN   Thank you for allowing me the opportunity to care for your patient. Please do not hesitate to contact me should you have any other questions.  Sincerely, [name] PA-C [facility] Phone: [phone] Fax: [phone]  09/19/2024, 8:33 AM        [1]  Current Outpatient Medications:    acyclovir (ZOVIRAX) 200 MG capsule, Take 200 mg by mouth See admin instructions. Take up to 5 times daily for cold sores, Disp: , Rfl:    albuterol  (VENTOLIN  HFA) 108 (90 Base) MCG/ACT inhaler, Inhale 2 puffs into the lungs every 4 (four) hours as needed., Disp: 18 g, Rfl: 1   Azelastine -Fluticasone  137-50 MCG/ACT SUSP, 1-2 Sprays in each nostil daily as needed., Disp: 23 g, Rfl: 5   EPINEPHrine  (EPIPEN  2-PAK) 0.3 mg/0.3 mL IJ SOAJ injection, Use as directed for severe allergic reaction, Disp: 2 each, Rfl: 1   levocetirizine (XYZAL ) 5 MG tablet, TAKE 1 TABLET(5 MG) BY MOUTH EVERY EVENING, Disp: 30 tablet, Rfl: 5   linaclotide  (LINZESS ) 290 MCG CAPS capsule, Take 1 capsule (290 mcg total) by mouth daily before breakfast., Disp: 90 capsule, Rfl: 3  PREVIDENT 5000 PLUS 1.1 % CREA dental cream, See admin instructions., Disp: , Rfl:    Vitamin D, Ergocalciferol, (DRISDOL) 1.25 MG (50000 UNIT) CAPS capsule, Take 50,000 Units by mouth once a week., Disp: , Rfl:

## 2025-02-20 ENCOUNTER — Ambulatory Visit: Admitting: Allergy & Immunology
